# Patient Record
Sex: Male | Born: 1960
Health system: Southern US, Community
[De-identification: ages and names within clinical notes are randomized; demographics above are authoritative.]

## PROBLEM LIST (undated history)

## (undated) DIAGNOSIS — C801 Malignant (primary) neoplasm, unspecified: Secondary | ICD-10-CM

## (undated) DIAGNOSIS — Z789 Other specified health status: Secondary | ICD-10-CM

## (undated) DIAGNOSIS — R112 Nausea with vomiting, unspecified: Secondary | ICD-10-CM

## (undated) DIAGNOSIS — N2889 Other specified disorders of kidney and ureter: Secondary | ICD-10-CM

## (undated) DIAGNOSIS — Z9889 Other specified postprocedural states: Secondary | ICD-10-CM

## (undated) HISTORY — PX: ROTATOR CUFF REPAIR: SHX139

## (undated) HISTORY — PX: TONSILLECTOMY: SUR1361

## (undated) HISTORY — PX: BACK SURGERY: SHX140

---

## 2002-09-06 ENCOUNTER — Encounter: Payer: Self-pay | Admitting: *Deleted

## 2002-09-06 ENCOUNTER — Encounter: Admission: RE | Admit: 2002-09-06 | Discharge: 2002-09-06 | Payer: Self-pay | Admitting: *Deleted

## 2005-03-02 ENCOUNTER — Encounter: Admission: RE | Admit: 2005-03-02 | Discharge: 2005-03-02 | Payer: Self-pay | Admitting: Rheumatology

## 2009-09-08 ENCOUNTER — Encounter: Admission: RE | Admit: 2009-09-08 | Discharge: 2009-10-03 | Payer: Self-pay | Admitting: Otolaryngology

## 2020-07-23 ENCOUNTER — Inpatient Hospital Stay (HOSPITAL_COMMUNITY)
Admission: EM | Admit: 2020-07-23 | Discharge: 2020-07-29 | DRG: 674 | Disposition: A | Discharge status: Home / Self Care | Payer: 59 | Attending: Internal Medicine | Admitting: Internal Medicine

## 2020-07-23 ENCOUNTER — Other Ambulatory Visit: Payer: Self-pay

## 2020-07-23 ENCOUNTER — Encounter (HOSPITAL_COMMUNITY): Payer: Self-pay

## 2020-07-23 ENCOUNTER — Emergency Department (HOSPITAL_COMMUNITY): Payer: 59

## 2020-07-23 DIAGNOSIS — E860 Dehydration: Secondary | ICD-10-CM | POA: Diagnosis present

## 2020-07-23 DIAGNOSIS — R6881 Early satiety: Secondary | ICD-10-CM | POA: Diagnosis not present

## 2020-07-23 DIAGNOSIS — D62 Acute posthemorrhagic anemia: Secondary | ICD-10-CM | POA: Diagnosis present

## 2020-07-23 DIAGNOSIS — N2889 Other specified disorders of kidney and ureter: Secondary | ICD-10-CM | POA: Diagnosis not present

## 2020-07-23 DIAGNOSIS — Z20822 Contact with and (suspected) exposure to covid-19: Secondary | ICD-10-CM | POA: Diagnosis present

## 2020-07-23 DIAGNOSIS — Y9355 Activity, bike riding: Secondary | ICD-10-CM

## 2020-07-23 DIAGNOSIS — R739 Hyperglycemia, unspecified: Secondary | ICD-10-CM | POA: Diagnosis present

## 2020-07-23 DIAGNOSIS — K5900 Constipation, unspecified: Secondary | ICD-10-CM | POA: Diagnosis not present

## 2020-07-23 DIAGNOSIS — S37012A Minor contusion of left kidney, initial encounter: Secondary | ICD-10-CM | POA: Diagnosis present

## 2020-07-23 DIAGNOSIS — N179 Acute kidney failure, unspecified: Secondary | ICD-10-CM | POA: Diagnosis present

## 2020-07-23 DIAGNOSIS — R9431 Abnormal electrocardiogram [ECG] [EKG]: Secondary | ICD-10-CM | POA: Diagnosis present

## 2020-07-23 HISTORY — DX: Other specified health status: Z78.9

## 2020-07-23 LAB — COMPREHENSIVE METABOLIC PANEL
ALT: 19 U/L (ref 0–44)
AST: 50 U/L — ABNORMAL HIGH (ref 15–41)
Albumin: 4.3 g/dL (ref 3.5–5.0)
Alkaline Phosphatase: 27 U/L — ABNORMAL LOW (ref 38–126)
Anion gap: 18 — ABNORMAL HIGH (ref 5–15)
BUN: 35 mg/dL — ABNORMAL HIGH (ref 6–20)
CO2: 20 mmol/L — ABNORMAL LOW (ref 22–32)
Calcium: 9.1 mg/dL (ref 8.9–10.3)
Chloride: 96 mmol/L — ABNORMAL LOW (ref 98–111)
Creatinine, Ser: 1.25 mg/dL — ABNORMAL HIGH (ref 0.61–1.24)
GFR, Estimated: >60 mL/min (ref >60)
Glucose, Bld: 228 mg/dL — ABNORMAL HIGH (ref 70–99)
Potassium: 3.8 mmol/L (ref 3.5–5.1)
Sodium: 134 mmol/L — ABNORMAL LOW (ref 135–145)
Total Bilirubin: 1.3 mg/dL — ABNORMAL HIGH (ref 0.3–1.2)
Total Protein: 6.8 g/dL (ref 6.5–8.1)

## 2020-07-23 LAB — URINALYSIS, ROUTINE W REFLEX MICROSCOPIC
Bilirubin Urine: NEGATIVE
Glucose, UA: NEGATIVE mg/dL
Hgb urine dipstick: NEGATIVE
Ketones, ur: 80 mg/dL — AB
Leukocytes,Ua: NEGATIVE
Nitrite: NEGATIVE
Protein, ur: NEGATIVE mg/dL
Specific Gravity, Urine: 1.035 — ABNORMAL HIGH (ref 1.005–1.030)
pH: 6 (ref 5.0–8.0)

## 2020-07-23 LAB — TYPE AND SCREEN
ABO/RH(D): A POS
Antibody Screen: NEGATIVE

## 2020-07-23 LAB — CBC
HCT: 34.4 % — ABNORMAL LOW (ref 39.0–52.0)
HCT: 33.2 % — ABNORMAL LOW (ref 39.0–52.0)
Hemoglobin: 11.5 g/dL — ABNORMAL LOW (ref 13.0–17.0)
Hemoglobin: 10.9 g/dL — ABNORMAL LOW (ref 13.0–17.0)
MCH: 30.8 pg (ref 26.0–34.0)
MCH: 30.9 pg (ref 26.0–34.0)
MCHC: 33.4 g/dL (ref 30.0–36.0)
MCHC: 32.8 g/dL (ref 30.0–36.0)
MCV: 92.2 fL (ref 80.0–100.0)
MCV: 94.1 fL (ref 80.0–100.0)
Platelets: 180 10*3/uL (ref 150–400)
Platelets: 188 10*3/uL (ref 150–400)
RBC: 3.73 MIL/uL — ABNORMAL LOW (ref 4.22–5.81)
RBC: 3.53 MIL/uL — ABNORMAL LOW (ref 4.22–5.81)
RDW: 12.4 % (ref 11.5–15.5)
RDW: 12.3 % (ref 11.5–15.5)
WBC: 11.9 10*3/uL — ABNORMAL HIGH (ref 4.0–10.5)
WBC: 12.2 10*3/uL — ABNORMAL HIGH (ref 4.0–10.5)
nRBC: 0 % (ref 0.0–0.2)
nRBC: 0 % (ref 0.0–0.2)

## 2020-07-23 LAB — RESPIRATORY PANEL BY RT PCR (FLU A&B, COVID)
Influenza A by PCR: NEGATIVE
Influenza B by PCR: NEGATIVE
SARS Coronavirus 2 by RT PCR: NEGATIVE

## 2020-07-23 LAB — PROTIME-INR
INR: 1.3 — ABNORMAL HIGH (ref 0.8–1.2)
Prothrombin Time: 15.4 seconds — ABNORMAL HIGH (ref 11.4–15.2)

## 2020-07-23 LAB — ABO/RH: ABO/RH(D): A POS

## 2020-07-23 MED ORDER — METOCLOPRAMIDE HCL 5 MG/ML IJ SOLN
10.0000 mg | Freq: Once | INTRAMUSCULAR | Status: AC
  Administered 2020-07-23: 10 mg via INTRAVENOUS
  Filled 2020-07-23: qty 2

## 2020-07-23 MED ORDER — HYDROMORPHONE HCL 1 MG/ML IJ SOLN
0.5000 mg | INTRAMUSCULAR | Status: DC | PRN
  Administered 2020-07-23 – 2020-07-26 (×17): 0.5 mg via INTRAVENOUS
  Filled 2020-07-23 (×17): qty 0.5

## 2020-07-23 MED ORDER — SODIUM CHLORIDE 0.9 % IV SOLN: INTRAVENOUS | Status: AC

## 2020-07-23 MED ORDER — ACETAMINOPHEN 325 MG PO TABS
650.0000 mg | ORAL_TABLET | Freq: Four times a day (QID) | ORAL | Status: DC | PRN
  Administered 2020-07-27: 19:00:00 650 mg via ORAL
  Filled 2020-07-23: qty 2

## 2020-07-23 MED ORDER — ACETAMINOPHEN 650 MG RE SUPP: 650.0000 mg | Freq: Four times a day (QID) | RECTAL | Status: DC | PRN

## 2020-07-23 MED ORDER — MORPHINE SULFATE (PF) 4 MG/ML IV SOLN
4.0000 mg | Freq: Once | INTRAVENOUS | Status: AC
  Administered 2020-07-23: 4 mg via INTRAVENOUS
  Filled 2020-07-23: qty 1

## 2020-07-23 MED ORDER — HYDROMORPHONE HCL 1 MG/ML IJ SOLN
0.5000 mg | Freq: Once | INTRAMUSCULAR | Status: AC
  Administered 2020-07-23: 0.5 mg via INTRAVENOUS
  Filled 2020-07-23: qty 1

## 2020-07-23 MED ORDER — ONDANSETRON HCL 4 MG/2ML IJ SOLN
4.0000 mg | Freq: Once | INTRAMUSCULAR | Status: AC
  Administered 2020-07-23: 4 mg via INTRAVENOUS
  Filled 2020-07-23: qty 2

## 2020-07-23 MED ORDER — SODIUM CHLORIDE 0.9 % IV BOLUS
1000.0000 mL | Freq: Once | INTRAVENOUS | Status: AC
  Administered 2020-07-23: 1000 mL via INTRAVENOUS

## 2020-07-23 MED ORDER — FENTANYL CITRATE (PF) 100 MCG/2ML IJ SOLN
100.0000 ug | Freq: Once | INTRAMUSCULAR | Status: AC
  Administered 2020-07-23: 100 ug via INTRAVENOUS
  Filled 2020-07-23: qty 2

## 2020-07-23 MED ORDER — IOHEXOL 300 MG/ML  SOLN
100.0000 mL | Freq: Once | INTRAMUSCULAR | Status: AC | PRN
  Administered 2020-07-23: 100 mL via INTRAVENOUS
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
## 2020-07-23 NOTE — ED Notes (Signed)
This nurse spoke with Dr. Erenest Blank about the possible need for a CT, provider at bedside speaking with patient.

## 2020-07-24 DIAGNOSIS — E860 Dehydration: Secondary | ICD-10-CM | POA: Diagnosis present

## 2020-07-24 DIAGNOSIS — N179 Acute kidney failure, unspecified: Secondary | ICD-10-CM | POA: Diagnosis present

## 2020-07-24 DIAGNOSIS — S37012A Minor contusion of left kidney, initial encounter: Secondary | ICD-10-CM | POA: Diagnosis present

## 2020-07-24 DIAGNOSIS — Y9355 Activity, bike riding: Secondary | ICD-10-CM | POA: Diagnosis not present

## 2020-07-24 DIAGNOSIS — N2889 Other specified disorders of kidney and ureter: Principal | ICD-10-CM

## 2020-07-24 DIAGNOSIS — Z20822 Contact with and (suspected) exposure to covid-19: Secondary | ICD-10-CM | POA: Diagnosis present

## 2020-07-24 DIAGNOSIS — K5900 Constipation, unspecified: Secondary | ICD-10-CM | POA: Diagnosis not present

## 2020-07-24 DIAGNOSIS — D62 Acute posthemorrhagic anemia: Secondary | ICD-10-CM | POA: Diagnosis present

## 2020-07-24 DIAGNOSIS — R9431 Abnormal electrocardiogram [ECG] [EKG]: Secondary | ICD-10-CM | POA: Diagnosis present

## 2020-07-24 DIAGNOSIS — R739 Hyperglycemia, unspecified: Secondary | ICD-10-CM | POA: Diagnosis present

## 2020-07-24 DIAGNOSIS — R6881 Early satiety: Secondary | ICD-10-CM | POA: Diagnosis not present

## 2020-07-24 LAB — MAGNESIUM: Magnesium: 2.1 mg/dL (ref 1.7–2.4)

## 2020-07-24 LAB — CBC
HCT: 32.2 % — ABNORMAL LOW (ref 39.0–52.0)
HCT: 33 % — ABNORMAL LOW (ref 39.0–52.0)
HCT: 31.1 % — ABNORMAL LOW (ref 39.0–52.0)
Hemoglobin: 10.7 g/dL — ABNORMAL LOW (ref 13.0–17.0)
Hemoglobin: 11 g/dL — ABNORMAL LOW (ref 13.0–17.0)
Hemoglobin: 10.5 g/dL — ABNORMAL LOW (ref 13.0–17.0)
MCH: 30.7 pg (ref 26.0–34.0)
MCH: 30.9 pg (ref 26.0–34.0)
MCH: 31 pg (ref 26.0–34.0)
MCHC: 33.2 g/dL (ref 30.0–36.0)
MCHC: 33.3 g/dL (ref 30.0–36.0)
MCHC: 33.8 g/dL (ref 30.0–36.0)
MCV: 92.5 fL (ref 80.0–100.0)
MCV: 92.7 fL (ref 80.0–100.0)
MCV: 91.7 fL (ref 80.0–100.0)
Platelets: 170 10*3/uL (ref 150–400)
Platelets: 152 10*3/uL (ref 150–400)
Platelets: 158 10*3/uL (ref 150–400)
RBC: 3.48 MIL/uL — ABNORMAL LOW (ref 4.22–5.81)
RBC: 3.56 MIL/uL — ABNORMAL LOW (ref 4.22–5.81)
RBC: 3.39 MIL/uL — ABNORMAL LOW (ref 4.22–5.81)
RDW: 12.5 % (ref 11.5–15.5)
RDW: 12.4 % (ref 11.5–15.5)
RDW: 12.6 % (ref 11.5–15.5)
WBC: 10.1 10*3/uL (ref 4.0–10.5)
WBC: 9.5 10*3/uL (ref 4.0–10.5)
WBC: 10.8 10*3/uL — ABNORMAL HIGH (ref 4.0–10.5)
nRBC: 0 % (ref 0.0–0.2)
nRBC: 0 % (ref 0.0–0.2)
nRBC: 0 % (ref 0.0–0.2)

## 2020-07-24 LAB — COMPREHENSIVE METABOLIC PANEL
ALT: 19 U/L (ref 0–44)
AST: 56 U/L — ABNORMAL HIGH (ref 15–41)
Albumin: 4 g/dL (ref 3.5–5.0)
Alkaline Phosphatase: 25 U/L — ABNORMAL LOW (ref 38–126)
Anion gap: 12 (ref 5–15)
BUN: 25 mg/dL — ABNORMAL HIGH (ref 6–20)
CO2: 22 mmol/L (ref 22–32)
Calcium: 8.8 mg/dL — ABNORMAL LOW (ref 8.9–10.3)
Chloride: 104 mmol/L (ref 98–111)
Creatinine, Ser: 0.93 mg/dL (ref 0.61–1.24)
GFR, Estimated: >60 mL/min (ref >60)
Glucose, Bld: 196 mg/dL — ABNORMAL HIGH (ref 70–99)
Potassium: 4.5 mmol/L (ref 3.5–5.1)
Sodium: 138 mmol/L (ref 135–145)
Total Bilirubin: 0.7 mg/dL (ref 0.3–1.2)
Total Protein: 6.4 g/dL — ABNORMAL LOW (ref 6.5–8.1)

## 2020-07-24 LAB — HIV ANTIBODY (ROUTINE TESTING W REFLEX): HIV Screen 4th Generation wRfx: NONREACTIVE

## 2020-07-24 MED ORDER — ONDANSETRON HCL 4 MG/2ML IJ SOLN
4.0000 mg | Freq: Four times a day (QID) | INTRAMUSCULAR | Status: DC | PRN
  Administered 2020-07-24 – 2020-07-26 (×6): 4 mg via INTRAVENOUS
  Filled 2020-07-24 (×5): qty 2

## 2020-07-24 MED ORDER — DOCUSATE SODIUM 100 MG PO CAPS
100.0000 mg | ORAL_CAPSULE | Freq: Two times a day (BID) | ORAL | Status: DC
  Administered 2020-07-24 – 2020-07-29 (×10): 100 mg via ORAL
  Filled 2020-07-24 (×9): qty 1

## 2020-07-24 MED ORDER — POLYETHYLENE GLYCOL 3350 17 G PO PACK
17.0000 g | PACK | Freq: Every day | ORAL | Status: DC | PRN
  Administered 2020-07-24 – 2020-07-25 (×2): 17 g via ORAL
  Filled 2020-07-24 (×2): qty 1

## 2020-07-24 MED ORDER — SENNA 8.6 MG PO TABS
1.0000 | ORAL_TABLET | Freq: Every day | ORAL | Status: DC
  Administered 2020-07-24 – 2020-07-29 (×6): 8.6 mg via ORAL
  Filled 2020-07-24 (×6): qty 1
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
## 2020-07-25 ENCOUNTER — Telehealth (HOSPITAL_COMMUNITY): Payer: Self-pay

## 2020-07-25 ENCOUNTER — Inpatient Hospital Stay (HOSPITAL_COMMUNITY): Payer: 59

## 2020-07-25 HISTORY — PX: IR RENAL SUPRASEL UNI S&I MOD SED: IMG655

## 2020-07-25 HISTORY — PX: IR RENAL SELECTIVE  UNI INC S&I MOD SED: IMG654

## 2020-07-25 HISTORY — PX: IR EMBO ART  VEN HEMORR LYMPH EXTRAV  INC GUIDE ROADMAPPING: IMG5450

## 2020-07-25 HISTORY — PX: IR US GUIDE VASC ACCESS RIGHT: IMG2390

## 2020-07-25 LAB — COMPREHENSIVE METABOLIC PANEL
ALT: 20 U/L (ref 0–44)
AST: 136 U/L — ABNORMAL HIGH (ref 15–41)
Albumin: 3.8 g/dL (ref 3.5–5.0)
Alkaline Phosphatase: 29 U/L — ABNORMAL LOW (ref 38–126)
Anion gap: 7 (ref 5–15)
BUN: 16 mg/dL (ref 6–20)
CO2: 26 mmol/L (ref 22–32)
Calcium: 8.7 mg/dL — ABNORMAL LOW (ref 8.9–10.3)
Chloride: 100 mmol/L (ref 98–111)
Creatinine, Ser: 0.83 mg/dL (ref 0.61–1.24)
GFR, Estimated: >60 mL/min (ref >60)
Glucose, Bld: 137 mg/dL — ABNORMAL HIGH (ref 70–99)
Potassium: 4.5 mmol/L (ref 3.5–5.1)
Sodium: 133 mmol/L — ABNORMAL LOW (ref 135–145)
Total Bilirubin: 0.9 mg/dL (ref 0.3–1.2)
Total Protein: 6.7 g/dL (ref 6.5–8.1)

## 2020-07-25 LAB — HEMOGLOBIN AND HEMATOCRIT, BLOOD
HCT: 26.1 % — ABNORMAL LOW (ref 39.0–52.0)
Hemoglobin: 8.7 g/dL — ABNORMAL LOW (ref 13.0–17.0)

## 2020-07-25 LAB — CBC
HCT: 28.9 % — ABNORMAL LOW (ref 39.0–52.0)
Hemoglobin: 9.6 g/dL — ABNORMAL LOW (ref 13.0–17.0)
MCH: 31.1 pg (ref 26.0–34.0)
MCHC: 33.2 g/dL (ref 30.0–36.0)
MCV: 93.5 fL (ref 80.0–100.0)
Platelets: 143 10*3/uL — ABNORMAL LOW (ref 150–400)
RBC: 3.09 MIL/uL — ABNORMAL LOW (ref 4.22–5.81)
RDW: 12.5 % (ref 11.5–15.5)
WBC: 11.1 10*3/uL — ABNORMAL HIGH (ref 4.0–10.5)
nRBC: 0 % (ref 0.0–0.2)

## 2020-07-25 LAB — HEMOGLOBIN A1C
Hgb A1c MFr Bld: 5.5 % (ref 4.8–5.6)
Mean Plasma Glucose: 111 mg/dL

## 2020-07-25 MED ORDER — IOHEXOL 350 MG/ML SOLN
100.0000 mL | Freq: Once | INTRAVENOUS | Status: AC | PRN
  Administered 2020-07-25: 16:00:00 100 mL via INTRAVENOUS

## 2020-07-25 MED ORDER — MIDAZOLAM HCL 2 MG/2ML IJ SOLN
INTRAMUSCULAR | Status: AC | PRN
  Administered 2020-07-25: 0.5 mg via INTRAVENOUS
  Administered 2020-07-25: 1 mg via INTRAVENOUS
  Administered 2020-07-25 (×4): 0.5 mg via INTRAVENOUS

## 2020-07-25 MED ORDER — IOHEXOL 300 MG/ML  SOLN
100.0000 mL | Freq: Once | INTRAMUSCULAR | Status: AC | PRN
  Administered 2020-07-25: 20:00:00 40 mL via INTRA_ARTERIAL

## 2020-07-25 MED ORDER — FENTANYL CITRATE (PF) 100 MCG/2ML IJ SOLN
INTRAMUSCULAR | Status: AC | PRN
Start: 2020-07-25 — End: 2020-07-25
  Administered 2020-07-25 (×7): 25 ug via INTRAVENOUS

## 2020-07-25 MED ORDER — FENTANYL CITRATE (PF) 100 MCG/2ML IJ SOLN
INTRAMUSCULAR | Status: AC
  Filled 2020-07-25: qty 4

## 2020-07-25 MED ORDER — HYDROCODONE-ACETAMINOPHEN 5-325 MG PO TABS
1.0000 | ORAL_TABLET | ORAL | Status: DC | PRN
  Administered 2020-07-25 – 2020-07-27 (×5): 2 via ORAL
  Administered 2020-07-27 (×2): 1 via ORAL
  Administered 2020-07-27 – 2020-07-28 (×2): 2 via ORAL
  Filled 2020-07-25: qty 2
  Filled 2020-07-25: qty 1
  Filled 2020-07-25 (×6): qty 2
  Filled 2020-07-25: qty 1
  Filled 2020-07-25: qty 2

## 2020-07-25 MED ORDER — SODIUM CHLORIDE 0.9 % IV SOLN
INTRAVENOUS | Status: AC | PRN
  Administered 2020-07-25: 50 mL/h via INTRAVENOUS

## 2020-07-25 MED ORDER — IOHEXOL 9 MG/ML PO SOLN: 500.0000 mL | ORAL | Status: DC

## 2020-07-25 MED ORDER — LIDOCAINE HCL 1 % IJ SOLN
INTRAMUSCULAR | Status: AC
  Administered 2020-07-25: 5 mL via SUBCUTANEOUS
  Filled 2020-07-25: qty 20

## 2020-07-25 MED ORDER — IOHEXOL 300 MG/ML  SOLN
100.0000 mL | Freq: Once | INTRAMUSCULAR | Status: AC | PRN
  Administered 2020-07-25: 20:00:00 66 mL via INTRA_ARTERIAL

## 2020-07-25 MED ORDER — HYDROMORPHONE HCL 1 MG/ML IJ SOLN
1.0000 mg | Freq: Once | INTRAMUSCULAR | Status: AC
  Administered 2020-07-25: 1 mg via INTRAVENOUS
  Filled 2020-07-25: qty 1

## 2020-07-25 MED ORDER — MIDAZOLAM HCL 2 MG/2ML IJ SOLN
INTRAMUSCULAR | Status: AC
  Filled 2020-07-25: qty 4

## 2020-07-25 MED ORDER — ONDANSETRON HCL 4 MG/2ML IJ SOLN
INTRAMUSCULAR | Status: AC
  Filled 2020-07-25: qty 2
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
## 2020-07-25 NOTE — Progress Notes (Signed)
Wasted .5mg  Versed and 75mcg Fentanyl  Witnessed by Jill Side.  Not available to pick in pyxis
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
## 2020-07-25 NOTE — Procedures (Signed)
  Procedure: L renal arteriogram, subselective coil embolization    EBL:   minimal Complications:  none immediate  See full dictation in [facility].  Dillard Cannon MD Main # [phone] Pager  [phone] Mobile [phone]

## 2020-07-26 LAB — COMPREHENSIVE METABOLIC PANEL
ALT: 19 U/L (ref 0–44)
AST: 124 U/L — ABNORMAL HIGH (ref 15–41)
Albumin: 3.4 g/dL — ABNORMAL LOW (ref 3.5–5.0)
Alkaline Phosphatase: 31 U/L — ABNORMAL LOW (ref 38–126)
Anion gap: 7 (ref 5–15)
BUN: 12 mg/dL (ref 6–20)
CO2: 28 mmol/L (ref 22–32)
Calcium: 8.6 mg/dL — ABNORMAL LOW (ref 8.9–10.3)
Chloride: 98 mmol/L (ref 98–111)
Creatinine, Ser: 0.86 mg/dL (ref 0.61–1.24)
GFR, Estimated: >60 mL/min (ref >60)
Glucose, Bld: 112 mg/dL — ABNORMAL HIGH (ref 70–99)
Potassium: 4.7 mmol/L (ref 3.5–5.1)
Sodium: 133 mmol/L — ABNORMAL LOW (ref 135–145)
Total Bilirubin: 0.7 mg/dL (ref 0.3–1.2)
Total Protein: 6.4 g/dL — ABNORMAL LOW (ref 6.5–8.1)

## 2020-07-26 LAB — CBC
HCT: 26.6 % — ABNORMAL LOW (ref 39.0–52.0)
Hemoglobin: 8.9 g/dL — ABNORMAL LOW (ref 13.0–17.0)
MCH: 31 pg (ref 26.0–34.0)
MCHC: 33.5 g/dL (ref 30.0–36.0)
MCV: 92.7 fL (ref 80.0–100.0)
Platelets: 126 10*3/uL — ABNORMAL LOW (ref 150–400)
RBC: 2.87 MIL/uL — ABNORMAL LOW (ref 4.22–5.81)
RDW: 12.1 % (ref 11.5–15.5)
WBC: 9.9 10*3/uL (ref 4.0–10.5)
nRBC: 0 % (ref 0.0–0.2)
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
## 2020-07-26 NOTE — Progress Notes (Signed)
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 Consolidation/atelectasis posteriorly in the visualized lung bases. Hepatobiliary: No focal liver abnormality is seen. No gallstones, gallbladder wall thickening, or biliary dilatation. Pancreas: Unremarkable. No pancreatic ductal dilatation or surrounding inflammatory changes. Spleen: Normal in size without focal abnormality. Adrenals/Urinary Tract: Right adrenal gland unremarkable. Left adrenal gland is somewhat obscured by regional retroperitoneal hemorrhage as before. Right kidney is normal. On the left, complex hemorrhagic upper pole process in the kidney measuring 15 x 12.8 cm maximum transverse dimensions (previously 14.7 x 12.2 by my measurement). This creates significant mass effect upon the upper pole renal parenchyma which is draped around the process. There is continued active arterial supply into the hemorrhagic process from upper pole left renal artery branches. A few scattered coarse calcifications at the lateral margin of the hemorrhagic process. The lower pole  enhances normally. No hydronephrosis. Stomach/Bowel: Stomach is decompressed. Small bowel is nondilated. Moderate proximal colonic fecal material, decompressed distally. Lymphatic: No abdominal or pelvic adenopathy. Reproductive: Prostate is unremarkable. Other: Slight increase in left retroperitoneal hemorrhage around the kidney and extending anterior to the iliopsoas musculature. There is a small volume abdominal and pelvic ascites, new since previous. No free air. Musculoskeletal: Postop changes in the lower lumbar spine. Negative for fracture or other acute bone abnormality. IMPRESSION: 1. Continued active arterial supply into the large left upper pole renal hemorrhagic process, as detailed above. 2. Slight increase in left retroperitoneal hemorrhage. 3. New small volume abdominal and pelvic ascites, and bilateral pleural effusions, left greater than right. Electronically Signed   By: Lucrezia Europe M.D.   On: 07/26/2020 10:46    Scheduled Meds: . docusate sodium  100 mg Oral BID  . senna  1 tablet Oral Daily   Continuous Infusions:    LOS: 2 days   Marylu Lund, MD [facility] Pager On Amion  If 7PM-7AM, please contact night-coverage 07/26/2020, 2:39 PM
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
## 2020-07-27 LAB — CBC
HCT: 24.3 % — ABNORMAL LOW (ref 39.0–52.0)
Hemoglobin: 8 g/dL — ABNORMAL LOW (ref 13.0–17.0)
MCH: 30.8 pg (ref 26.0–34.0)
MCHC: 32.9 g/dL (ref 30.0–36.0)
MCV: 93.5 fL (ref 80.0–100.0)
Platelets: 156 10*3/uL (ref 150–400)
RBC: 2.6 MIL/uL — ABNORMAL LOW (ref 4.22–5.81)
RDW: 12.2 % (ref 11.5–15.5)
WBC: 8 10*3/uL (ref 4.0–10.5)
nRBC: 0 % (ref 0.0–0.2)

## 2020-07-27 LAB — COMPREHENSIVE METABOLIC PANEL
ALT: 18 U/L (ref 0–44)
AST: 102 U/L — ABNORMAL HIGH (ref 15–41)
Albumin: 3.1 g/dL — ABNORMAL LOW (ref 3.5–5.0)
Alkaline Phosphatase: 36 U/L — ABNORMAL LOW (ref 38–126)
Anion gap: 9 (ref 5–15)
BUN: 17 mg/dL (ref 6–20)
CO2: 27 mmol/L (ref 22–32)
Calcium: 8.5 mg/dL — ABNORMAL LOW (ref 8.9–10.3)
Chloride: 96 mmol/L — ABNORMAL LOW (ref 98–111)
Creatinine, Ser: 1.04 mg/dL (ref 0.61–1.24)
GFR, Estimated: >60 mL/min (ref >60)
Glucose, Bld: 118 mg/dL — ABNORMAL HIGH (ref 70–99)
Potassium: 3.8 mmol/L (ref 3.5–5.1)
Sodium: 132 mmol/L — ABNORMAL LOW (ref 135–145)
Total Bilirubin: 0.7 mg/dL (ref 0.3–1.2)
Total Protein: 6.4 g/dL — ABNORMAL LOW (ref 6.5–8.1)

## 2020-07-27 LAB — HEMOGLOBIN AND HEMATOCRIT, BLOOD
HCT: 23.4 % — ABNORMAL LOW (ref 39.0–52.0)
Hemoglobin: 7.8 g/dL — ABNORMAL LOW (ref 13.0–17.0)

## 2020-07-27 MED ORDER — POLYETHYLENE GLYCOL 3350 17 GM/SCOOP PO POWD
1.0000 | Freq: Once | ORAL | Status: AC
  Administered 2020-07-27: 16:00:00 255 g via ORAL
  Filled 2020-07-27: qty 255
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
## 2020-07-27 NOTE — Progress Notes (Signed)
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 renal veins, IVC, iliac venous system. Circumaortic left renal vein, an anatomic variant. No venous pathology identified. Review of the MIP images confirms the above findings. NON-VASCULAR Lower chest: New small pleural effusions, left greater than right. Consolidation/atelectasis posteriorly in the visualized lung bases. Hepatobiliary: No focal liver abnormality is seen. No gallstones, gallbladder wall thickening, or biliary dilatation. Pancreas: Unremarkable. No pancreatic ductal dilatation or surrounding inflammatory changes. Spleen: Normal in size without focal abnormality. Adrenals/Urinary Tract: Right adrenal gland unremarkable. Left adrenal gland is somewhat obscured by regional retroperitoneal hemorrhage as before. Right kidney is normal. On the left, complex hemorrhagic upper pole process in the kidney measuring 15 x 12.8 cm maximum transverse dimensions (previously 14.7 x 12.2 by my measurement). This creates significant mass  effect upon the upper pole renal parenchyma which is draped around the process. There is continued active arterial supply into the hemorrhagic process from upper pole left renal artery branches. A few scattered coarse calcifications at the lateral margin of the hemorrhagic process. The lower pole enhances normally. No hydronephrosis. Stomach/Bowel: Stomach is decompressed. Small bowel is nondilated. Moderate proximal colonic fecal material, decompressed distally. Lymphatic: No abdominal or pelvic adenopathy. Reproductive: Prostate is unremarkable. Other: Slight increase in left retroperitoneal hemorrhage around the kidney and extending anterior to the iliopsoas musculature. There is a small volume abdominal and pelvic ascites, new since previous. No free air. Musculoskeletal: Postop changes in the lower lumbar spine. Negative for fracture or other acute bone abnormality. IMPRESSION: 1. Continued active arterial supply into the large left upper pole renal hemorrhagic process, as detailed above. 2. Slight increase in left retroperitoneal hemorrhage. 3. New small volume abdominal and pelvic ascites, and bilateral pleural effusions, left greater than right. Electronically Signed   By: Lucrezia Europe M.D.   On: 07/26/2020 10:46    Scheduled Meds: . docusate sodium  100 mg Oral BID  . polyethylene glycol powder  1 Container Oral Once  . senna  1 tablet Oral Daily   Continuous Infusions:    LOS: 3 days   Marylu Lund, MD [facility] Pager On Amion  If 7PM-7AM, please contact night-coverage 07/27/2020, 1:38 PM

## 2020-07-28 DIAGNOSIS — Y9355 Activity, bike riding: Secondary | ICD-10-CM

## 2020-07-28 LAB — CBC
HCT: 22 % — ABNORMAL LOW (ref 39.0–52.0)
Hemoglobin: 7.4 g/dL — ABNORMAL LOW (ref 13.0–17.0)
MCH: 31 pg (ref 26.0–34.0)
MCHC: 33.6 g/dL (ref 30.0–36.0)
MCV: 92.1 fL (ref 80.0–100.0)
Platelets: 188 10*3/uL (ref 150–400)
RBC: 2.39 MIL/uL — ABNORMAL LOW (ref 4.22–5.81)
RDW: 12.4 % (ref 11.5–15.5)
WBC: 7.7 10*3/uL (ref 4.0–10.5)
nRBC: 0 % (ref 0.0–0.2)

## 2020-07-28 LAB — COMPREHENSIVE METABOLIC PANEL
ALT: 35 U/L (ref 0–44)
AST: 96 U/L — ABNORMAL HIGH (ref 15–41)
Albumin: 2.8 g/dL — ABNORMAL LOW (ref 3.5–5.0)
Alkaline Phosphatase: 62 U/L (ref 38–126)
Anion gap: 9 (ref 5–15)
BUN: 18 mg/dL (ref 6–20)
CO2: 28 mmol/L (ref 22–32)
Calcium: 8.4 mg/dL — ABNORMAL LOW (ref 8.9–10.3)
Chloride: 95 mmol/L — ABNORMAL LOW (ref 98–111)
Creatinine, Ser: 1.01 mg/dL (ref 0.61–1.24)
GFR, Estimated: >60 mL/min (ref >60)
Glucose, Bld: 125 mg/dL — ABNORMAL HIGH (ref 70–99)
Potassium: 3.5 mmol/L (ref 3.5–5.1)
Sodium: 132 mmol/L — ABNORMAL LOW (ref 135–145)
Total Bilirubin: 0.8 mg/dL (ref 0.3–1.2)
Total Protein: 6.1 g/dL — ABNORMAL LOW (ref 6.5–8.1)

## 2020-07-28 LAB — HEMOGLOBIN AND HEMATOCRIT, BLOOD
HCT: 24.8 % — ABNORMAL LOW (ref 39.0–52.0)
Hemoglobin: 8.3 g/dL — ABNORMAL LOW (ref 13.0–17.0)

## 2020-07-28 MED ORDER — ACETAMINOPHEN 325 MG PO TABS: 650.0000 mg | ORAL_TABLET | Freq: Four times a day (QID) | ORAL | Status: DC | PRN

## 2020-07-28 MED ORDER — HYDROCODONE-ACETAMINOPHEN 5-325 MG PO TABS: 1.0000 | ORAL_TABLET | ORAL | Status: DC | PRN

## 2020-07-28 MED ORDER — ACETAMINOPHEN 650 MG RE SUPP: 650.0000 mg | Freq: Four times a day (QID) | RECTAL | Status: DC | PRN

## 2020-07-28 MED ORDER — ENSURE MAX PROTEIN PO LIQD
11.0000 [oz_av] | Freq: Two times a day (BID) | ORAL | Status: DC
  Administered 2020-07-28 – 2020-07-29 (×3): 11 [oz_av] via ORAL

## 2020-07-28 MED ORDER — ACETAMINOPHEN 10 MG/ML IV SOLN
1000.0000 mg | Freq: Four times a day (QID) | INTRAVENOUS | Status: AC
  Administered 2020-07-28 – 2020-07-29 (×4): 1000 mg via INTRAVENOUS
  Filled 2020-07-28 (×4): qty 100
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
## 2020-07-28 NOTE — Progress Notes (Signed)
Subjective: Patient reports continued pain, appetite diminished.  He would rather not be on narcotics.  Objective: Vital signs in last 24 hours: Temp:  [98.4 F (36.9 C)-99.7 F (37.6 C)] 98.4 F (36.9 C) (10/25 0735) Pulse Rate:  [84-91] 84 (10/25 0735) Resp:  [18-20] 18 (10/25 0735) BP: (112-126)/(68-74) 124/71 (10/25 0735) SpO2:  [60 %-95 %] 95 % (10/25 0857)  Intake/Output from previous day: 10/24 0701 - 10/25 0700 In: 1440 [P.O.:1440] Out: 0  Intake/Output this shift: No intake/output data recorded.  Physical Exam:  Constitutional: Vital signs reviewed. WD WN in NAD   .  Voice is week. Eyes: PERRL, No scleral icterus.   Cardiovascular: RRR Pulmonary/Chest: Normal effort Extremities: No cyanosis or edema   Lab Results: Recent Labs    07/27/20 0613 07/27/20 1135 07/28/20 0500  HGB 8.0* 7.8* 7.4*  HCT 24.3* 23.4* 22.0*   BMET Recent Labs    07/27/20 0613 07/28/20 0500  NA 132* 132*  K 3.8 3.5  CL 96* 95*  CO2 27 28  GLUCOSE 118* 125*  BUN 17 18  CREATININE 1.04 1.01  CALCIUM 8.5* 8.4*   No results for input(s): LABPT, INR in the last 72 hours. No results for input(s): LABURIN in the last 72 hours. Results for orders placed or performed during the hospital encounter of 07/23/20  Respiratory Panel by RT PCR (Flu A&B, Covid) - Nasopharyngeal Swab     Status: None   Collection Time: 07/23/20  8:30 PM   Specimen: Nasopharyngeal Swab  Result Value Ref Range Status   SARS Coronavirus 2 by RT PCR NEGATIVE NEGATIVE Final    Comment: (NOTE) SARS-CoV-2 target nucleic acids are NOT DETECTED.  The SARS-CoV-2 RNA is generally detectable in upper respiratoy specimens during the acute phase of infection. The lowest concentration of SARS-CoV-2 viral copies this assay can detect is 131 copies/mL. A negative result does not preclude SARS-Cov-2 infection and should not be used as the sole basis for treatment or other patient management decisions. A negative result  may occur with  improper specimen collection/handling, submission of specimen other than nasopharyngeal swab, presence of viral mutation(s) within the areas targeted by this assay, and inadequate number of viral copies (<131 copies/mL). A negative result must be combined with clinical observations, patient history, and epidemiological information. The expected result is Negative.  Fact Sheet for Patients:  PinkCheek.be  Fact Sheet for Healthcare Providers:  GravelBags.it  This test is no t yet approved or cleared by the [place] FDA and  has been authorized for detection and/or diagnosis of SARS-CoV-2 by FDA under an Emergency Use Authorization (EUA). This EUA will remain  in effect (meaning this test can be used) for the duration of the COVID-19 declaration under Section 564(b)(1) of the Act, 21 U.S.C. section 360bbb-3(b)(1), unless the authorization is terminated or revoked sooner.     Influenza A by PCR NEGATIVE NEGATIVE Final   Influenza B by PCR NEGATIVE NEGATIVE Final    Comment: (NOTE) The Xpert Xpress SARS-CoV-2/FLU/RSV assay is intended as an aid in  the diagnosis of influenza from Nasopharyngeal swab specimens and  should not be used as a sole basis for treatment. Nasal washings and  aspirates are unacceptable for Xpert Xpress SARS-CoV-2/FLU/RSV  testing.  Fact Sheet for Patients: PinkCheek.be  Fact Sheet for Healthcare Providers: GravelBags.it  This test is not yet approved or cleared by the [place] FDA and  has been authorized for detection and/or diagnosis of SARS-CoV-2 by  FDA under an Emergency  Use Authorization (EUA). This EUA will remain  in effect (meaning this test can be used) for the duration of the  Covid-19 declaration under Section 564(b)(1) of the Act, 21  U.S.C. section 360bbb-3(b)(1), unless the authorization is  terminated  or revoked. Performed at [facility], [place] [address] [place], [place] [address]     Studies/Results: No results found.  Assessment/Plan:     Left perirenal hematoma, significant bleed, necessitating embolization 3 days ago.  Slightly decreased hemoglobin this morning can paired yesterday, but overall stable hemodynamically.  He requests something besides narcotics, I will order IV Tylenol.  I have ordered protein shake supplements, I would recommend following at least 24 more hours to assure stable hemoglobin.    I did discuss with him the fact that he may have an underlying neoplasm as he does have neovascularity noted on arteriogram.  We will obviously need to follow-up after discharge.   LOS: 4 days   Jorja Loa 07/28/2020, 9:16 AM

## 2020-07-29 LAB — HEMOGLOBIN AND HEMATOCRIT, BLOOD
HCT: 23.9 % — ABNORMAL LOW (ref 39.0–52.0)
Hemoglobin: 7.9 g/dL — ABNORMAL LOW (ref 13.0–17.0)

## 2020-07-29 MED ORDER — ONDANSETRON HCL 4 MG PO TABS: 4.0000 mg | ORAL_TABLET | Freq: Every day | ORAL | 0 refills | Status: AC | PRN

## 2020-07-29 MED ORDER — SENNA 8.6 MG PO TABS: 1.0000 | ORAL_TABLET | Freq: Every day | ORAL | 0 refills | Status: AC

## 2020-07-29 MED ORDER — IRON 325 (65 FE) MG PO TABS: 1.0000 | ORAL_TABLET | Freq: Every day | ORAL | 0 refills | Status: AC

## 2020-07-29 MED ORDER — DOCUSATE SODIUM 100 MG PO CAPS: 100.0000 mg | ORAL_CAPSULE | Freq: Two times a day (BID) | ORAL | 0 refills | Status: AC

## 2020-07-29 MED ORDER — FOLIC ACID 1 MG PO TABS: 1.0000 mg | ORAL_TABLET | Freq: Every day | ORAL | 0 refills | Status: AC

## 2020-07-29 MED ORDER — ACETAMINOPHEN 325 MG PO TABS: 650.0000 mg | ORAL_TABLET | Freq: Four times a day (QID) | ORAL | 0 refills | Status: AC | PRN

## 2020-07-29 MED ORDER — POLYETHYLENE GLYCOL 3350 17 G PO PACK: 17.0000 g | PACK | Freq: Every day | ORAL | 0 refills | Status: AC | PRN

## 2020-07-29 NOTE — Discharge Summary (Signed)
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 Musculoskeletal: Postop changes in the lower lumbar spine. Negative for fracture or other acute bone abnormality. IMPRESSION: 1. Continued active arterial supply into the large left upper pole renal hemorrhagic process, as detailed above. 2. Slight increase in left retroperitoneal hemorrhage. 3. New small volume abdominal and pelvic ascites, and bilateral pleural effusions, left greater than right. Electronically Signed   By: Lucrezia Europe M.D.   On: 07/26/2020 10:46     Subjective: Eager to go home today  Discharge Exam: Vitals:   07/29/20 0557 07/29/20 0942  BP: 123/75 131/80  Pulse: 82 85  Resp: 18 20  Temp: 98.4 F (36.9 C) 99.1 F (37.3 C)  SpO2: 96% 96%   Vitals:   07/28/20 1831 07/28/20 2223 07/29/20 0557 07/29/20 0942  BP:  135/75 123/75 131/80  Pulse:  82 82 85  Resp:  18 18 20   Temp:  98.7 F (37.1 C) 98.4 F (36.9 C) 99.1 F (37.3 C)  TempSrc:  Oral Oral Oral  SpO2: 95% 100% 96% 96%  Weight:      Height:        General: Pt is alert, awake, not in acute distress Cardiovascular: RRR, S1/S2 +, no rubs, no gallops Respiratory: CTA bilaterally, no wheezing, no rhonchi Abdominal: Soft, NT, ND, bowel sounds + Extremities: no edema, no cyanosis   The results of significant diagnostics  from this hospitalization (including imaging, microbiology, ancillary and laboratory) are listed below for reference.     Microbiology: Recent Results (from the past 240 hour(s))  Respiratory Panel by RT PCR (Flu A&B, Covid) - Nasopharyngeal Swab     Status: None   Collection Time: 07/23/20  8:30 PM   Specimen: Nasopharyngeal Swab  Result Value Ref Range Status   SARS Coronavirus 2 by RT PCR NEGATIVE NEGATIVE Final    Comment: (NOTE) SARS-CoV-2 target nucleic acids are NOT DETECTED.  The SARS-CoV-2 RNA is generally detectable in upper respiratoy specimens during the acute phase of infection. The lowest concentration of SARS-CoV-2 viral copies this assay can detect is 131 copies/mL. A negative result does not preclude SARS-Cov-2 infection and should not be used as the sole basis for treatment or other patient management decisions. A negative result may occur with  improper specimen collection/handling, submission of specimen other than nasopharyngeal swab, presence of viral mutation(s) within the areas targeted by this assay, and inadequate number of viral copies (<131 copies/mL). A negative result must be combined with clinical observations, patient history, and epidemiological information. The expected result is Negative.  Fact Sheet for Patients:  PinkCheek.be  Fact Sheet for Healthcare Providers:  GravelBags.it  This test is no t yet approved or cleared by the [place] FDA and  has been authorized for detection and/or diagnosis of SARS-CoV-2 by FDA under an Emergency Use Authorization (EUA). This EUA will remain  in effect (meaning this test can be used) for the duration of the COVID-19 declaration under Section 564(b)(1) of the Act, 21 U.S.C. section 360bbb-3(b)(1), unless the authorization is terminated or revoked sooner.     Influenza A by PCR NEGATIVE NEGATIVE Final   Influenza B by PCR NEGATIVE NEGATIVE  Final    Comment: (NOTE) The Xpert Xpress SARS-CoV-2/FLU/RSV assay is intended as an aid in  the diagnosis of influenza from Nasopharyngeal swab specimens and  should not be used as a sole basis for treatment. Nasal washings and  aspirates are unacceptable for Xpert Xpress SARS-CoV-2/FLU/RSV  testing.  Fact Sheet for Patients: PinkCheek.be  Fact Sheet for Healthcare Providers: GravelBags.it  This test is not yet approved or cleared by the [place] FDA and  has been authorized for detection and/or diagnosis of SARS-CoV-2 by  FDA under an Emergency Use Authorization (EUA). This EUA will remain  in effect (meaning this test can be used) for the duration of the  Covid-19 declaration under Section 564(b)(1) of the Act, 21  U.S.C. section 360bbb-3(b)(1), unless the authorization is  terminated or revoked. Performed at [facility], [place] [address], [place], [place] [address]      Labs: BNP (last  3 results) No results for input(s): BNP in the last 8760 hours. Basic Metabolic Panel: Recent Labs  Lab 07/24/20 0537 07/25/20 0420 07/26/20 0806 07/27/20 0613 07/28/20 0500  NA 138 133* 133* 132* 132*  K 4.5 4.5 4.7 3.8 3.5  CL 104 100 98 96* 95*  CO2 22 26 28 27 28   GLUCOSE 196* 137* 112* 118* 125*  BUN 25* 16 12 17 18   CREATININE 0.93 0.83 0.86 1.04 1.01  CALCIUM 8.8* 8.7* 8.6* 8.5* 8.4*  MG 2.1  --   --   --   --    Liver Function Tests: Recent Labs  Lab 07/24/20 0537 07/25/20 0420 07/26/20 0806 07/27/20 0613 07/28/20 0500  AST 56* 136* 124* 102* 96*  ALT 19 20 19 18  35  ALKPHOS 25* 29* 31* 36* 62  BILITOT 0.7 0.9 0.7 0.7 0.8  PROT 6.4* 6.7 6.4* 6.4* 6.1*  ALBUMIN 4.0 3.8 3.4* 3.1* 2.8*   No results for input(s): LIPASE, AMYLASE in the last 168 hours. No results for input(s): AMMONIA in the last 168 hours. CBC: Recent Labs  Lab 07/24/20 0954 07/24/20 0954 07/25/20 0420  07/25/20 1136 07/26/20 0806 07/26/20 0806 07/27/20 0613 07/27/20 1135 07/28/20 0500 07/28/20 1642 07/29/20 0441  WBC 10.8*  --  11.1*  --  9.9  --  8.0  --  7.7  --   --   HGB 10.5*   < > 9.6*   < > 8.9*   < > 8.0* 7.8* 7.4* 8.3* 7.9*  HCT 31.1*   < > 28.9*   < > 26.6*   < > 24.3* 23.4* 22.0* 24.8* 23.9*  MCV 91.7  --  93.5  --  92.7  --  93.5  --  92.1  --   --   PLT 158  --  143*  --  126*  --  156  --  188  --   --    < > = values in this interval not displayed.   Cardiac Enzymes: No results for input(s): CKTOTAL, CKMB, CKMBINDEX, TROPONINI in the last 168 hours. BNP: Invalid input(s): POCBNP CBG: No results for input(s): GLUCAP in the last 168 hours. D-Dimer No results for input(s): DDIMER in the last 72 hours. Hgb A1c No results for input(s): HGBA1C in the last 72 hours. Lipid Profile No results for input(s): CHOL, HDL, LDLCALC, TRIG, CHOLHDL, LDLDIRECT in the last 72 hours. Thyroid function studies No results for input(s): TSH, T4TOTAL, T3FREE, THYROIDAB in the last 72 hours.  Invalid input(s): FREET3 Anemia work up No results for input(s): VITAMINB12, FOLATE, FERRITIN, TIBC, IRON, RETICCTPCT in the last 72 hours. Urinalysis    Component Value Date/Time   COLORURINE YELLOW 07/23/2020 1949   APPEARANCEUR CLEAR 07/23/2020 1949   LABSPEC 1.035 (H) 07/23/2020 1949   PHURINE 6.0 07/23/2020 1949   GLUCOSEU NEGATIVE 07/23/2020 1949   HGBUR NEGATIVE 07/23/2020 1949   [place] NEGATIVE 07/23/2020 1949   KETONESUR 80 (A) 07/23/2020 1949   PROTEINUR NEGATIVE 07/23/2020 1949   NITRITE NEGATIVE 07/23/2020 1949   LEUKOCYTESUR NEGATIVE 07/23/2020 1949   Sepsis Labs Invalid input(s): PROCALCITONIN,  WBC,  LACTICIDVEN Microbiology Recent Results (from the past 240 hour(s))  Respiratory Panel by RT PCR (Flu A&B, Covid) - Nasopharyngeal Swab     Status: None   Collection Time: 07/23/20  8:30 PM   Specimen: Nasopharyngeal Swab  Result Value Ref Range Status   SARS  Coronavirus 2 by RT PCR NEGATIVE NEGATIVE Final    Comment: (NOTE)  SARS-CoV-2 target nucleic acids are NOT DETECTED.  The SARS-CoV-2 RNA is generally detectable in upper respiratoy specimens during the acute phase of infection. The lowest concentration of SARS-CoV-2 viral copies this assay can detect is 131 copies/mL. A negative result does not preclude SARS-Cov-2 infection and should not be used as the sole basis for treatment or other patient management decisions. A negative result may occur with  improper specimen collection/handling, submission of specimen other than nasopharyngeal swab, presence of viral mutation(s) within the areas targeted by this assay, and inadequate number of viral copies (<131 copies/mL). A negative result must be combined with clinical observations, patient history, and epidemiological information. The expected result is Negative.  Fact Sheet for Patients:  PinkCheek.be  Fact Sheet for Healthcare Providers:  GravelBags.it  This test is no t yet approved or cleared by the [place] FDA and  has been authorized for detection and/or diagnosis of SARS-CoV-2 by FDA under an Emergency Use Authorization (EUA). This EUA will remain  in effect (meaning this test can be used) for the duration of the COVID-19 declaration under Section 564(b)(1) of the Act, 21 U.S.C. section 360bbb-3(b)(1), unless the authorization is terminated or revoked sooner.     Influenza A by PCR NEGATIVE NEGATIVE Final   Influenza B by PCR NEGATIVE NEGATIVE Final    Comment: (NOTE) The Xpert Xpress SARS-CoV-2/FLU/RSV assay is intended as an aid in  the diagnosis of influenza from Nasopharyngeal swab specimens and  should not be used as a sole basis for treatment. Nasal washings and  aspirates are unacceptable for Xpert Xpress SARS-CoV-2/FLU/RSV  testing.  Fact Sheet for  Patients: PinkCheek.be  Fact Sheet for Healthcare Providers: GravelBags.it  This test is not yet approved or cleared by the [place] FDA and  has been authorized for detection and/or diagnosis of SARS-CoV-2 by  FDA under an Emergency Use Authorization (EUA). This EUA will remain  in effect (meaning this test can be used) for the duration of the  Covid-19 declaration under Section 564(b)(1) of the Act, 21  U.S.C. section 360bbb-3(b)(1), unless the authorization is  terminated or revoked. Performed at [facility], [place] [address] [place], [place] [address]    Time spent: 30 min  SIGNED:   Marylu Lund, MD  [facility] 07/29/2020, 11:08 AM  If 7PM-7AM, please contact night-coverage

## 2020-07-29 NOTE — Progress Notes (Signed)
Patient ID: MCKOY BHAKTA, male   DOB: [birth year], 59 y.o.   MRN: [id number]    Subjective: Pt doing well.  Pain controlled with decreased narcotic pain requirement.  Objective: Vital signs in last 24 hours: Temp:  [98.4 F (36.9 C)-98.9 F (37.2 C)] 98.4 F (36.9 C) (10/26 0557) Pulse Rate:  [81-82] 82 (10/26 0557) Resp:  [16-18] 18 (10/26 0557) BP: (115-135)/(72-75) 123/75 (10/26 0557) SpO2:  [85 %-100 %] 96 % (10/26 0557)  Intake/Output from previous day: 10/25 0701 - 10/26 0700 In: 1610 [P.O.:1210; IV Piggyback:400] Out: 0  Intake/Output this shift: No intake/output data recorded.  Physical Exam:  General: Alert and oriented GU: [place] CVAT  Lab Results: Recent Labs    07/28/20 0500 07/28/20 1642 07/29/20 0441  HGB 7.4* 8.3* 7.9*  HCT 22.0* 24.8* 23.9*   BMET Recent Labs    07/27/20 0613 07/28/20 0500  NA 132* 132*  K 3.8 3.5  CL 96* 95*  CO2 27 28  GLUCOSE 118* 125*  BUN 17 18  CREATININE 1.04 1.01  CALCIUM 8.5* 8.4*     Studies/Results: No results found.  Assessment/Plan: 1) Left perinephric hematoma due to trauma: Hgb stabilized.  Chancellor for discharge from GU standpoint.  Instructed patient to avoid strenuous activity for 1 month.  Dr. Diona Fanti to arrange outpatient follow up to repeat imaging.   LOS: 5 days   [name] 07/29/2020, 7:50 AM

## 2020-07-29 NOTE — Progress Notes (Signed)
Patient was given discharge instructions with spouse on speaker phone, and all questions were answered.  Patient was taken to main exit by wheelchair.

## 2020-10-16 ENCOUNTER — Other Ambulatory Visit: Payer: Self-pay | Admitting: Urology

## 2020-10-16 DIAGNOSIS — D49512 Neoplasm of unspecified behavior of left kidney: Secondary | ICD-10-CM

## 2020-10-24 ENCOUNTER — Other Ambulatory Visit: Payer: Self-pay | Admitting: Urology

## 2020-10-24 ENCOUNTER — Ambulatory Visit (HOSPITAL_COMMUNITY)
Admission: RE | Admit: 2020-10-24 | Discharge: 2020-10-24 | Disposition: A | Discharge status: Home / Self Care | Payer: 59 | Source: Ambulatory Visit | Attending: Urology | Admitting: Urology

## 2020-10-24 DIAGNOSIS — D49512 Neoplasm of unspecified behavior of left kidney: Secondary | ICD-10-CM | POA: Diagnosis not present

## 2020-10-24 MED ORDER — GADOBUTROL 1 MMOL/ML IV SOLN
7.0000 mL | Freq: Once | INTRAVENOUS | Status: AC | PRN
  Administered 2020-10-24: 7 mL via INTRAVENOUS

## 2020-11-06 NOTE — Progress Notes (Addendum)
COVID Vaccine Completed: x 3 Date COVID Vaccine completed:  12/21 Booster COVID vaccine manufacturer: [place]   PCP - Lottie Dawson, PA Cardiologist - N/A  Chest x-ray - N/A EKG - 07-23-20 in Epic Stress Test -  ECHO -  Cardiac Cath -  Pacemaker/ICD device last checked:  Sleep Study - N/A CPAP -   Fasting Blood Sugar - N/A Checks Blood Sugar _____ times a day  Blood Thinner Instructions:N/A Aspirin Instructions: Last Dose:  Anesthesia review: N/A  Patient denies shortness of breath, fever, cough and chest pain at PAT appointment  Activity:  Patient able to climb a flight of stairs and do housework independently.  Patient verbalized understanding of instructions that were given to them at the PAT appointment. Patient was also instructed that they will need to review over the PAT instructions again at home before surgery.

## 2020-11-06 NOTE — Patient Instructions (Addendum)
DUE TO COVID-19 ONLY ONE VISITOR IS ALLOWED TO COME WITH YOU AND STAY IN THE WAITING ROOM ONLY DURING PRE OP AND PROCEDURE.   IF YOU WILL BE ADMITTED INTO THE HOSPITAL YOU ARE ALLOWED ONE SUPPORT PERSON DURING VISITATION HOURS ONLY (10AM -8PM)   . The support person may change daily. . The support person must pass our screening, gel in and out, and wear a mask at all times, including in the patient's room. . Patients must also wear a mask when staff or their support person are in the room.   COVID SWAB TESTING MUST BE COMPLETED ON:  Monday, 11-10-20 @ 10:10 AM   [address] [place], [place] [address]  (Must self quarantine after testing. Follow instructions on handout.)        Your procedure is scheduled on:  Thursday, 11-13-20   Report to [facility] Main  Entrance    Report to admitting at 9:30 AM   Call this number if you have problems the morning of surgery [phone]   Do not eat food :After Midnight.   May have liquids until 8:30 AM day of surgery  CLEAR LIQUID DIET  Foods Allowed                                                                     Foods Excluded  Water, Black Coffee and tea, regular and decaf              liquids that you cannot  Plain Jell-O in any flavor  (No red)                                    see through such as: Fruit ices (not with fruit pulp)                                      milk, soups, orange juice              Iced Popsicles (No red)                                      All solid food                                   Apple juices Sports drinks like Gatorade (No red) Lightly seasoned clear broth or consume(fat free)      Sugar, honey syrupThe day of surgery:       Oral Hygiene is also important to reduce your risk of infection.                                    Remember - BRUSH YOUR TEETH THE MORNING OF SURGERY WITH YOUR REGULAR TOOTHPASTE   Do NOT smoke after Midnight   Take these medicines the morning of surgery with A  SIP OF WATER: None  You may not have any metal on your body including jewelry, and body piercings             Do not wear lotions, powders, perfumes/cologne, or deodorant             Men may shave face and neck.   Do not bring valuables to the hospital. [place].   Contacts, dentures or bridgework may not be worn into surgery.   Bring small overnight bag day of surgery.               Please read over the following fact sheets you were given: IF YOU HAVE QUESTIONS ABOUT YOUR PRE OP INSTRUCTIONS PLEASE CALL  [place] - Preparing for Surgery Before surgery, you can play an important role.  Because skin is not sterile, your skin needs to be as free of germs as possible.  You can reduce the number of germs on your skin by washing with CHG (chlorahexidine gluconate) soap before surgery.  CHG is an antiseptic cleaner which kills germs and bonds with the skin to continue killing germs even after washing. Please DO NOT use if you have an allergy to CHG or antibacterial soaps.  If your skin becomes reddened/irritated stop using the CHG and inform your nurse when you arrive at Short Stay. Do not shave (including legs and underarms) for at least 48 hours prior to the first CHG shower.  You may shave your face/neck.  Please follow these instructions carefully:  1.  Shower with CHG Soap the night before surgery and the  morning of surgery.  2.  If you choose to wash your hair, wash your hair first as usual with your normal  shampoo.  3.  After you shampoo, rinse your hair and body thoroughly to remove the shampoo.                             4.  Use CHG as you would any other liquid soap.  You can apply chg directly to the skin and wash.  Gently with a scrungie or clean washcloth.  5.  Apply the CHG Soap to your body ONLY FROM THE NECK DOWN.   Do   not use on face/ open                           Wound or open  sores. Avoid contact with eyes, ears mouth and   genitals (private parts).                       Wash face,  Genitals (private parts) with your normal soap.             6.  Wash thoroughly, paying special attention to the area where your    surgery  will be performed.  7.  Thoroughly rinse your body with warm water from the neck down.  8.  DO NOT shower/wash with your normal soap after using and rinsing off the CHG Soap.                9.  Pat yourself dry with a clean towel.            10.  Wear clean pajamas.            11.  Place clean sheets on  your bed the night of your first shower and do not  sleep with pets. Day of Surgery : Do not apply any lotions/deodorants the morning of surgery.  Please wear clean clothes to the hospital/surgery center.  FAILURE TO FOLLOW THESE INSTRUCTIONS MAY RESULT IN THE CANCELLATION OF YOUR SURGERY  PATIENT SIGNATURE_________________________________  NURSE SIGNATURE__________________________________  ________________________________________________________________________   Brandon Booker  An incentive spirometer is a tool that can help keep your lungs clear and active. This tool measures how well you are filling your lungs with each breath. Taking long deep breaths may help reverse or decrease the chance of developing breathing (pulmonary) problems (especially infection) following:  A long period of time when you are unable to move or be active. BEFORE THE PROCEDURE   If the spirometer includes an indicator to show your best effort, your nurse or respiratory therapist will set it to a desired goal.  If possible, sit up straight or lean slightly forward. Try not to slouch.  Hold the incentive spirometer in an upright position. INSTRUCTIONS FOR USE  1. Sit on the edge of your bed if possible, or sit up as far as you can in bed or on a chair. 2. Hold the incentive spirometer in an upright position. 3. Breathe out normally. 4. Place the  mouthpiece in your mouth and seal your lips tightly around it. 5. Breathe in slowly and as deeply as possible, raising the piston or the ball toward the top of the column. 6. Hold your breath for 3-5 seconds or for as long as possible. Allow the piston or ball to fall to the bottom of the column. 7. Remove the mouthpiece from your mouth and breathe out normally. 8. Rest for a few seconds and repeat Steps 1 through 7 at least 10 times every 1-2 hours when you are awake. Take your time and take a few normal breaths between deep breaths. 9. The spirometer may include an indicator to show your best effort. Use the indicator as a goal to work toward during each repetition. 10. After each set of 10 deep breaths, practice coughing to be sure your lungs are clear. If you have an incision (the cut made at the time of surgery), support your incision when coughing by placing a pillow or rolled up towels firmly against it. Once you are able to get out of bed, walk around indoors and cough well. You may stop using the incentive spirometer when instructed by your caregiver.  RISKS AND COMPLICATIONS  Take your time so you do not get dizzy or light-headed.  If you are in pain, you may need to take or ask for pain medication before doing incentive spirometry. It is harder to take a deep breath if you are having pain. AFTER USE  Rest and breathe slowly and easily.  It can be helpful to keep track of a log of your progress. Your caregiver can provide you with a simple table to help with this. If you are using the spirometer at home, follow these instructions: [place] IF:   You are having difficultly using the spirometer.  You have trouble using the spirometer as often as instructed.  Your pain medication is not giving enough relief while using the spirometer.  You develop fever of 100.5 F (38.1 C) or higher. SEEK IMMEDIATE MEDICAL CARE IF:   You cough up bloody sputum that had not been present  before.  You develop fever of 102 F (38.9 C) or greater.  You develop  worsening pain at or near the incision site. MAKE SURE YOU:   Understand these instructions.  Will watch your condition.  Will get help right away if you are not doing well or get worse. Document Released: 01/31/2007 Document Revised: 12/13/2011 Document Reviewed: 04/03/2007 ExitCare Patient Information 2014 Exit[employer], [place].   ________________________________________________________________________  WHAT IS A BLOOD TRANSFUSION? Blood Transfusion Information  A transfusion is the replacement of blood or some of its parts. Blood is made up of multiple cells which provide different functions.  Red blood cells carry oxygen and are used for blood loss replacement.  White blood cells fight against infection.  Platelets control bleeding.  Plasma helps clot blood.  Other blood products are available for specialized needs, such as hemophilia or other clotting disorders. BEFORE THE TRANSFUSION  Who gives blood for transfusions?   Healthy volunteers who are fully evaluated to make sure their blood is safe. This is blood bank blood. Transfusion therapy is the safest it has ever been in the practice of medicine. Before blood is taken from a donor, a complete history is taken to make sure that person has no history of diseases nor engages in risky social behavior (examples are intravenous drug use or sexual activity with multiple partners). The donor's travel history is screened to minimize risk of transmitting infections, such as malaria. The donated blood is tested for signs of infectious diseases, such as HIV and hepatitis. The blood is then tested to be sure it is compatible with you in order to minimize the chance of a transfusion reaction. If you or a relative donates blood, this is often done in anticipation of surgery and is not appropriate for emergency situations. It takes many days to process the donated  blood. RISKS AND COMPLICATIONS Although transfusion therapy is very safe and saves many lives, the main dangers of transfusion include:   Getting an infectious disease.  Developing a transfusion reaction. This is an allergic reaction to something in the blood you were given. Every precaution is taken to prevent this. The decision to have a blood transfusion has been considered carefully by your caregiver before blood is given. Blood is not given unless the benefits outweigh the risks. AFTER THE TRANSFUSION  Right after receiving a blood transfusion, you will usually feel much better and more energetic. This is especially true if your red blood cells have gotten low (anemic). The transfusion raises the level of the red blood cells which carry oxygen, and this usually causes an energy increase.  The nurse administering the transfusion will monitor you carefully for complications. HOME CARE INSTRUCTIONS  No special instructions are needed after a transfusion. You may find your energy is better. Speak with your caregiver about any limitations on activity for underlying diseases you may have. SEEK MEDICAL CARE IF:   Your condition is not improving after your transfusion.  You develop redness or irritation at the intravenous (IV) site. SEEK IMMEDIATE MEDICAL CARE IF:  Any of the following symptoms occur over the next 12 hours:  Shaking chills.  You have a temperature by mouth above 102 F (38.9 C), not controlled by medicine.  Chest, back, or muscle pain.  People around you feel you are not acting correctly or are confused.  Shortness of breath or difficulty breathing.  Dizziness and fainting.  You get a rash or develop hives.  You have a decrease in urine output.  Your urine turns a dark color or changes to pink, red, or brown. Any of the following  symptoms occur over the next 10 days:  You have a temperature by mouth above 102 F (38.9 C), not controlled by  medicine.  Shortness of breath.  Weakness after normal activity.  The white part of the eye turns yellow (jaundice).  You have a decrease in the amount of urine or are urinating less often.  Your urine turns a dark color or changes to pink, red, or brown. Document Released: 09/17/2000 Document Revised: 12/13/2011 Document Reviewed: 05/06/2008 [employer] Patient Information 2014 [place], [place].  _______________________________________________________________________

## 2020-11-07 ENCOUNTER — Encounter (HOSPITAL_COMMUNITY)
Admission: RE | Admit: 2020-11-07 | Discharge: 2020-11-07 | Disposition: A | Discharge status: Home / Self Care | Payer: 59 | Source: Ambulatory Visit | Attending: Urology | Admitting: Urology

## 2020-11-07 ENCOUNTER — Encounter (HOSPITAL_COMMUNITY): Payer: Self-pay

## 2020-11-07 ENCOUNTER — Other Ambulatory Visit: Payer: Self-pay

## 2020-11-07 DIAGNOSIS — Z01812 Encounter for preprocedural laboratory examination: Secondary | ICD-10-CM | POA: Insufficient documentation

## 2020-11-07 HISTORY — DX: Other specified postprocedural states: R11.2

## 2020-11-07 HISTORY — DX: Other specified postprocedural states: Z98.890

## 2020-11-07 HISTORY — DX: Other specified disorders of kidney and ureter: N28.89

## 2020-11-07 LAB — CBC
HCT: 45.1 % (ref 39.0–52.0)
Hemoglobin: 14.8 g/dL (ref 13.0–17.0)
MCH: 29.2 pg (ref 26.0–34.0)
MCHC: 32.8 g/dL (ref 30.0–36.0)
MCV: 89 fL (ref 80.0–100.0)
Platelets: 227 10*3/uL (ref 150–400)
RBC: 5.07 MIL/uL (ref 4.22–5.81)
RDW: 12.7 % (ref 11.5–15.5)
WBC: 4.4 10*3/uL (ref 4.0–10.5)
nRBC: 0 % (ref 0.0–0.2)

## 2020-11-07 LAB — BASIC METABOLIC PANEL
Anion gap: 9 (ref 5–15)
BUN: 24 mg/dL — ABNORMAL HIGH (ref 6–20)
CO2: 29 mmol/L (ref 22–32)
Calcium: 9.7 mg/dL (ref 8.9–10.3)
Chloride: 103 mmol/L (ref 98–111)
Creatinine, Ser: 0.88 mg/dL (ref 0.61–1.24)
GFR, Estimated: >60 mL/min (ref >60)
Glucose, Bld: 97 mg/dL (ref 70–99)
Potassium: 5 mmol/L (ref 3.5–5.1)
Sodium: 141 mmol/L (ref 135–145)

## 2020-11-07 NOTE — Progress Notes (Deleted)
CBC results sent to Dr. Alinda Money to review.

## 2020-11-10 ENCOUNTER — Other Ambulatory Visit (HOSPITAL_COMMUNITY)
Admission: RE | Admit: 2020-11-10 | Discharge: 2020-11-10 | Disposition: A | Discharge status: Home / Self Care | Payer: 59 | Source: Ambulatory Visit | Attending: Urology | Admitting: Urology

## 2020-11-10 DIAGNOSIS — Z20822 Contact with and (suspected) exposure to covid-19: Secondary | ICD-10-CM | POA: Insufficient documentation

## 2020-11-10 DIAGNOSIS — Z01812 Encounter for preprocedural laboratory examination: Secondary | ICD-10-CM | POA: Insufficient documentation

## 2020-11-11 LAB — SARS CORONAVIRUS 2 (TAT 6-24 HRS): SARS Coronavirus 2: NEGATIVE
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
## 2020-11-13 ENCOUNTER — Other Ambulatory Visit (HOSPITAL_COMMUNITY): Payer: Self-pay | Admitting: Physician Assistant

## 2020-11-13 ENCOUNTER — Encounter (HOSPITAL_COMMUNITY)
Admission: RE | Disposition: A | Discharge status: Home / Self Care | Payer: Self-pay | Source: Home / Self Care | Attending: Urology

## 2020-11-13 ENCOUNTER — Inpatient Hospital Stay (HOSPITAL_COMMUNITY): Payer: 59 | Admitting: Certified Registered Nurse Anesthetist

## 2020-11-13 ENCOUNTER — Other Ambulatory Visit: Payer: Self-pay

## 2020-11-13 ENCOUNTER — Inpatient Hospital Stay (HOSPITAL_COMMUNITY)
Admission: RE | Admit: 2020-11-13 | Discharge: 2020-11-18 | DRG: 657 | Disposition: A | Discharge status: Home / Self Care | Payer: 59 | Attending: Urology | Admitting: Urology

## 2020-11-13 ENCOUNTER — Encounter (HOSPITAL_COMMUNITY): Payer: Self-pay | Admitting: Urology

## 2020-11-13 ENCOUNTER — Inpatient Hospital Stay (HOSPITAL_COMMUNITY): Payer: 59 | Admitting: Physician Assistant

## 2020-11-13 DIAGNOSIS — Z20822 Contact with and (suspected) exposure to covid-19: Secondary | ICD-10-CM | POA: Diagnosis present

## 2020-11-13 DIAGNOSIS — D49512 Neoplasm of unspecified behavior of left kidney: Secondary | ICD-10-CM | POA: Diagnosis present

## 2020-11-13 DIAGNOSIS — Z8042 Family history of malignant neoplasm of prostate: Secondary | ICD-10-CM

## 2020-11-13 DIAGNOSIS — K567 Ileus, unspecified: Secondary | ICD-10-CM | POA: Diagnosis not present

## 2020-11-13 DIAGNOSIS — C642 Malignant neoplasm of left kidney, except renal pelvis: Secondary | ICD-10-CM | POA: Diagnosis present

## 2020-11-13 DIAGNOSIS — Z4659 Encounter for fitting and adjustment of other gastrointestinal appliance and device: Secondary | ICD-10-CM

## 2020-11-13 DIAGNOSIS — K9189 Other postprocedural complications and disorders of digestive system: Secondary | ICD-10-CM | POA: Diagnosis not present

## 2020-11-13 DIAGNOSIS — J3801 Paralysis of vocal cords and larynx, unilateral: Secondary | ICD-10-CM | POA: Diagnosis present

## 2020-11-13 DIAGNOSIS — Z978 Presence of other specified devices: Secondary | ICD-10-CM

## 2020-11-13 HISTORY — PX: LAPAROSCOPIC NEPHRECTOMY: SHX1930

## 2020-11-13 LAB — BASIC METABOLIC PANEL
Anion gap: 9 (ref 5–15)
BUN: 14 mg/dL (ref 6–20)
CO2: 26 mmol/L (ref 22–32)
Calcium: 8.7 mg/dL — ABNORMAL LOW (ref 8.9–10.3)
Chloride: 102 mmol/L (ref 98–111)
Creatinine, Ser: 1.27 mg/dL — ABNORMAL HIGH (ref 0.61–1.24)
GFR, Estimated: >60 mL/min (ref >60)
Glucose, Bld: 181 mg/dL — ABNORMAL HIGH (ref 70–99)
Potassium: 4.3 mmol/L (ref 3.5–5.1)
Sodium: 137 mmol/L (ref 135–145)

## 2020-11-13 LAB — TYPE AND SCREEN
ABO/RH(D): A POS
Antibody Screen: NEGATIVE

## 2020-11-13 LAB — HEMOGLOBIN AND HEMATOCRIT, BLOOD
HCT: 42.6 % (ref 39.0–52.0)
Hemoglobin: 13.8 g/dL (ref 13.0–17.0)

## 2020-11-13 SURGERY — NEPHRECTOMY, RADICAL, LAPAROSCOPIC, ADULT
Anesthesia: General | Laterality: Left

## 2020-11-13 MED ORDER — ONDANSETRON HCL 4 MG/2ML IJ SOLN
INTRAMUSCULAR | Status: DC | PRN
Start: 2020-11-13 — End: 2020-11-13
  Administered 2020-11-13: 4 mg via INTRAVENOUS

## 2020-11-13 MED ORDER — PROPOFOL 10 MG/ML IV BOLUS
INTRAVENOUS | Status: DC | PRN
  Administered 2020-11-13: 200 mg via INTRAVENOUS

## 2020-11-13 MED ORDER — AMISULPRIDE (ANTIEMETIC) 5 MG/2ML IV SOLN: 10.0000 mg | Freq: Once | INTRAVENOUS | Status: DC | PRN

## 2020-11-13 MED ORDER — MIDAZOLAM HCL 5 MG/5ML IJ SOLN
INTRAMUSCULAR | Status: DC | PRN
  Administered 2020-11-13: 2 mg via INTRAVENOUS

## 2020-11-13 MED ORDER — FENTANYL CITRATE (PF) 100 MCG/2ML IJ SOLN
25.0000 ug | INTRAMUSCULAR | Status: DC | PRN
  Administered 2020-11-13 (×2): 50 ug via INTRAVENOUS

## 2020-11-13 MED ORDER — HYDROMORPHONE HCL 1 MG/ML IJ SOLN
INTRAMUSCULAR | Status: AC
  Filled 2020-11-13: qty 1

## 2020-11-13 MED ORDER — HYDROMORPHONE HCL 1 MG/ML IJ SOLN: 0.5000 mg | INTRAMUSCULAR | Status: DC | PRN

## 2020-11-13 MED ORDER — FENTANYL CITRATE (PF) 100 MCG/2ML IJ SOLN
INTRAMUSCULAR | Status: AC
  Administered 2020-11-13: 50 ug via INTRAVENOUS
  Filled 2020-11-13: qty 2

## 2020-11-13 MED ORDER — SCOPOLAMINE 1 MG/3DAYS TD PT72
MEDICATED_PATCH | TRANSDERMAL | Status: DC | PRN
  Administered 2020-11-13: 1 via TRANSDERMAL

## 2020-11-13 MED ORDER — OXYCODONE HCL 5 MG PO TABS
ORAL_TABLET | ORAL | Status: AC
  Administered 2020-11-13: 5 mg via ORAL
  Filled 2020-11-13: qty 1

## 2020-11-13 MED ORDER — ROCURONIUM BROMIDE 10 MG/ML (PF) SYRINGE
PREFILLED_SYRINGE | INTRAVENOUS | Status: AC
  Filled 2020-11-13: qty 10

## 2020-11-13 MED ORDER — DEXTROSE-NACL 5-0.45 % IV SOLN: INTRAVENOUS | Status: DC

## 2020-11-13 MED ORDER — MIDAZOLAM HCL 2 MG/2ML IJ SOLN
INTRAMUSCULAR | Status: AC
  Filled 2020-11-13: qty 2

## 2020-11-13 MED ORDER — TRAMADOL HCL 50 MG PO TABS
50.0000 mg | ORAL_TABLET | Freq: Four times a day (QID) | ORAL | 0 refills | Status: DC | PRN
Start: 2020-11-13 — End: 2021-08-20

## 2020-11-13 MED ORDER — DIPHENHYDRAMINE HCL 12.5 MG/5ML PO ELIX
12.5000 mg | ORAL_SOLUTION | Freq: Four times a day (QID) | ORAL | Status: DC | PRN
Start: 2020-11-13 — End: 2020-11-19

## 2020-11-13 MED ORDER — SUGAMMADEX SODIUM 200 MG/2ML IV SOLN
INTRAVENOUS | Status: DC | PRN
  Administered 2020-11-13: 150 mg via INTRAVENOUS

## 2020-11-13 MED ORDER — MAGNESIUM CITRATE PO SOLN
0.5000 | Freq: Once | ORAL | Status: DC
  Filled 2020-11-13: qty 296

## 2020-11-13 MED ORDER — DOCUSATE SODIUM 100 MG PO CAPS
100.0000 mg | ORAL_CAPSULE | Freq: Two times a day (BID) | ORAL | Status: DC
  Administered 2020-11-13 – 2020-11-18 (×4): 100 mg via ORAL
  Filled 2020-11-13 (×5): qty 1

## 2020-11-13 MED ORDER — CHLORHEXIDINE GLUCONATE CLOTH 2 % EX PADS: 6.0000 | MEDICATED_PAD | Freq: Every day | CUTANEOUS | Status: DC

## 2020-11-13 MED ORDER — SCOPOLAMINE 1 MG/3DAYS TD PT72
MEDICATED_PATCH | TRANSDERMAL | Status: AC
  Filled 2020-11-13: qty 1

## 2020-11-13 MED ORDER — GLYCOPYRROLATE 0.2 MG/ML IJ SOLN
INTRAMUSCULAR | Status: DC | PRN
  Administered 2020-11-13: .1 mg via INTRAVENOUS

## 2020-11-13 MED ORDER — SODIUM CHLORIDE (PF) 0.9 % IJ SOLN
INTRAMUSCULAR | Status: AC
  Filled 2020-11-13: qty 10

## 2020-11-13 MED ORDER — ONDANSETRON HCL 4 MG/2ML IJ SOLN
INTRAMUSCULAR | Status: AC
  Filled 2020-11-13: qty 2

## 2020-11-13 MED ORDER — ONDANSETRON HCL 4 MG/2ML IJ SOLN: 4.0000 mg | Freq: Once | INTRAMUSCULAR | Status: DC | PRN

## 2020-11-13 MED ORDER — LACTATED RINGERS IV SOLN: INTRAVENOUS | Status: DC | PRN

## 2020-11-13 MED ORDER — OXYCODONE HCL 5 MG PO TABS: 5.0000 mg | ORAL_TABLET | Freq: Once | ORAL | Status: AC | PRN

## 2020-11-13 MED ORDER — SODIUM CHLORIDE (PF) 0.9 % IJ SOLN
INTRAMUSCULAR | Status: DC | PRN
  Administered 2020-11-13: 20 mL

## 2020-11-13 MED ORDER — HYDROMORPHONE HCL 1 MG/ML IJ SOLN
INTRAMUSCULAR | Status: DC | PRN
  Administered 2020-11-13 (×2): .2 mg via INTRAVENOUS
  Administered 2020-11-13 (×3): .4 mg via INTRAVENOUS

## 2020-11-13 MED ORDER — PHENYLEPHRINE HCL-NACL 10-0.9 MG/250ML-% IV SOLN
INTRAVENOUS | Status: DC | PRN
  Administered 2020-11-13: 35 ug/min via INTRAVENOUS

## 2020-11-13 MED ORDER — BUPIVACAINE LIPOSOME 1.3 % IJ SUSP
20.0000 mL | Freq: Once | INTRAMUSCULAR | Status: AC
  Administered 2020-11-13: 20 mL
  Filled 2020-11-13: qty 20

## 2020-11-13 MED ORDER — DEXAMETHASONE SODIUM PHOSPHATE 10 MG/ML IJ SOLN
INTRAMUSCULAR | Status: AC
  Filled 2020-11-13: qty 1

## 2020-11-13 MED ORDER — STERILE WATER FOR IRRIGATION IR SOLN
Status: DC | PRN
  Administered 2020-11-13: 1000 mL

## 2020-11-13 MED ORDER — ORAL CARE MOUTH RINSE: 15.0000 mL | Freq: Once | OROMUCOSAL | Status: AC

## 2020-11-13 MED ORDER — HYDROMORPHONE HCL 2 MG/ML IJ SOLN
INTRAMUSCULAR | Status: AC
  Filled 2020-11-13: qty 1

## 2020-11-13 MED ORDER — FENTANYL CITRATE (PF) 100 MCG/2ML IJ SOLN
INTRAMUSCULAR | Status: DC | PRN
  Administered 2020-11-13: 100 ug via INTRAVENOUS
  Administered 2020-11-13 (×3): 50 ug via INTRAVENOUS

## 2020-11-13 MED ORDER — SODIUM CHLORIDE (PF) 0.9 % IJ SOLN
INTRAMUSCULAR | Status: AC
  Filled 2020-11-13: qty 20

## 2020-11-13 MED ORDER — HYDROMORPHONE HCL 1 MG/ML IJ SOLN
0.2500 mg | INTRAMUSCULAR | Status: DC | PRN
  Administered 2020-11-13: 0.5 mg via INTRAVENOUS

## 2020-11-13 MED ORDER — LIDOCAINE HCL (PF) 2 % IJ SOLN
INTRAMUSCULAR | Status: AC
  Filled 2020-11-13: qty 5

## 2020-11-13 MED ORDER — HYDROMORPHONE HCL 1 MG/ML IJ SOLN
INTRAMUSCULAR | Status: AC
  Administered 2020-11-13: 0.5 mg via INTRAVENOUS
  Filled 2020-11-13: qty 1

## 2020-11-13 MED ORDER — OXYCODONE HCL 5 MG PO TABS
5.0000 mg | ORAL_TABLET | ORAL | Status: DC | PRN
  Administered 2020-11-13 – 2020-11-14 (×2): 5 mg via ORAL
  Filled 2020-11-13 (×2): qty 2
  Filled 2020-11-13: qty 1

## 2020-11-13 MED ORDER — PROPOFOL 10 MG/ML IV BOLUS
INTRAVENOUS | Status: AC
  Filled 2020-11-13: qty 20

## 2020-11-13 MED ORDER — CEFAZOLIN SODIUM-DEXTROSE 2-4 GM/100ML-% IV SOLN
2.0000 g | Freq: Once | INTRAVENOUS | Status: AC
  Administered 2020-11-13: 2 g via INTRAVENOUS
  Filled 2020-11-13: qty 100

## 2020-11-13 MED ORDER — ACETAMINOPHEN 10 MG/ML IV SOLN
1000.0000 mg | Freq: Four times a day (QID) | INTRAVENOUS | Status: DC
  Administered 2020-11-13 – 2020-11-14 (×3): 1000 mg via INTRAVENOUS
  Filled 2020-11-13 (×3): qty 100

## 2020-11-13 MED ORDER — CEFAZOLIN SODIUM-DEXTROSE 1-4 GM/50ML-% IV SOLN
1.0000 g | Freq: Three times a day (TID) | INTRAVENOUS | Status: AC
  Administered 2020-11-13 – 2020-11-14 (×2): 1 g via INTRAVENOUS
  Filled 2020-11-13 (×2): qty 50

## 2020-11-13 MED ORDER — DEXAMETHASONE SODIUM PHOSPHATE 10 MG/ML IJ SOLN
INTRAMUSCULAR | Status: DC | PRN
  Administered 2020-11-13: 10 mg via INTRAVENOUS

## 2020-11-13 MED ORDER — FENTANYL CITRATE (PF) 100 MCG/2ML IJ SOLN
INTRAMUSCULAR | Status: AC
  Filled 2020-11-13: qty 2

## 2020-11-13 MED ORDER — ROCURONIUM BROMIDE 10 MG/ML (PF) SYRINGE
PREFILLED_SYRINGE | INTRAVENOUS | Status: DC | PRN
  Administered 2020-11-13 (×5): 20 mg via INTRAVENOUS
  Administered 2020-11-13: 70 mg via INTRAVENOUS
  Administered 2020-11-13: 20 mg via INTRAVENOUS
  Administered 2020-11-13: 10 mg via INTRAVENOUS
  Administered 2020-11-13: 20 mg via INTRAVENOUS

## 2020-11-13 MED ORDER — OXYCODONE HCL 5 MG/5ML PO SOLN: 5.0000 mg | Freq: Once | ORAL | Status: AC | PRN

## 2020-11-13 MED ORDER — DIPHENHYDRAMINE HCL 50 MG/ML IJ SOLN: 12.5000 mg | Freq: Four times a day (QID) | INTRAMUSCULAR | Status: DC | PRN

## 2020-11-13 MED ORDER — CHLORHEXIDINE GLUCONATE 0.12 % MT SOLN
15.0000 mL | Freq: Once | OROMUCOSAL | Status: AC
  Administered 2020-11-13: 15 mL via OROMUCOSAL

## 2020-11-13 MED ORDER — FENTANYL CITRATE (PF) 250 MCG/5ML IJ SOLN
INTRAMUSCULAR | Status: AC
  Filled 2020-11-13: qty 5

## 2020-11-13 MED ORDER — ONDANSETRON HCL 4 MG/2ML IJ SOLN
4.0000 mg | INTRAMUSCULAR | Status: DC | PRN
  Administered 2020-11-15 – 2020-11-18 (×5): 4 mg via INTRAVENOUS
  Filled 2020-11-13 (×5): qty 2

## 2020-11-13 MED ORDER — LACTATED RINGERS IV SOLN: INTRAVENOUS | Status: DC

## 2020-11-13 MED ORDER — LACTATED RINGERS IR SOLN
Status: DC | PRN
  Administered 2020-11-13: 1000 mL

## 2020-11-13 MED ORDER — LIDOCAINE 2% (20 MG/ML) 5 ML SYRINGE
INTRAMUSCULAR | Status: DC | PRN
  Administered 2020-11-13: 100 mg via INTRAVENOUS

## 2020-11-13 MED FILL — traMADol HCL 50 MG TABS: 50 | 6 days supply | Qty: 20 | Fill #0

## 2020-11-13 SURGICAL SUPPLY — 51 items
ADH SKN CLS APL DERMABOND .7 (GAUZE/BANDAGES/DRESSINGS) ×1
AGENT HMST KT MTR STRL THRMB (HEMOSTASIS) ×1
APL PRP STRL LF DISP 70% ISPRP (MISCELLANEOUS) ×1
BAG LAPAROSCOPIC 12 15 PORT 16 (BASKET) ×1 IMPLANT
BAG RETRIEVAL 12/15 (BASKET)
BAG SPEC THK2 15X12 ZIP CLS (MISCELLANEOUS) ×1
BAG ZIPLOCK 12X15 (MISCELLANEOUS) ×2 IMPLANT
BLADE EXTENDED COATED 6.5IN (ELECTRODE) IMPLANT
CHLORAPREP W/TINT 26 (MISCELLANEOUS) ×2 IMPLANT
CLIP VESOLOCK LG 6/CT PURPLE (CLIP) ×2 IMPLANT
CLIP VESOLOCK MED LG 6/CT (CLIP) ×2 IMPLANT
CLIP VESOLOCK XL 6/CT (CLIP) IMPLANT
COVER SURGICAL LIGHT HANDLE (MISCELLANEOUS) ×2 IMPLANT
COVER WAND RF STERILE (DRAPES) IMPLANT
CUTTER FLEX LINEAR 45M (STAPLE) ×1 IMPLANT
DERMABOND ADVANCED (GAUZE/BANDAGES/DRESSINGS) ×1
DERMABOND ADVANCED .7 DNX12 (GAUZE/BANDAGES/DRESSINGS) ×1 IMPLANT
DRAPE INCISE IOBAN 66X45 STRL (DRAPES) ×2 IMPLANT
DRAPE LAPAROSCOPIC ABDOMINAL (DRAPES) IMPLANT
DRAPE WARM FLUID 44X44 (DRAPES) IMPLANT
ELECT PENCIL ROCKER SW 15FT (MISCELLANEOUS) ×2 IMPLANT
ELECT REM PT RETURN 15FT ADLT (MISCELLANEOUS) ×2 IMPLANT
GLOVE SURG ENC MOIS LTX SZ6.5 (GLOVE) ×2 IMPLANT
GLOVE SURG ENC TEXT LTX SZ7.5 (GLOVE) ×2 IMPLANT
GOWN STRL REUS W/TWL LRG LVL3 (GOWN DISPOSABLE) ×4 IMPLANT
GRASPER SUT TROCAR 14GX15 (MISCELLANEOUS) ×1 IMPLANT
HEMOSTAT SURGICEL 4X8 (HEMOSTASIS) ×1 IMPLANT
HOLDER FOLEY CATH W/STRAP (MISCELLANEOUS) ×2 IMPLANT
IRRIG SUCT STRYKERFLOW 2 WTIP (MISCELLANEOUS) ×2
IRRIGATION SUCT STRKRFLW 2 WTP (MISCELLANEOUS) ×1 IMPLANT
KIT BASIN OR (CUSTOM PROCEDURE TRAY) ×2 IMPLANT
KIT TURNOVER KIT A (KITS) ×2 IMPLANT
POUCH ENDO CATCH II 15MM (MISCELLANEOUS) ×1 IMPLANT
RELOAD 45 VASCULAR/THIN (ENDOMECHANICALS) ×4 IMPLANT
RELOAD STAPLE 45 2.5 WHT GRN (ENDOMECHANICALS) IMPLANT
SCISSORS LAP 5X35 DISP (ENDOMECHANICALS) IMPLANT
SET TUBE SMOKE EVAC HIGH FLOW (TUBING) ×1 IMPLANT
SHEARS HARMONIC ACE PLUS 36CM (ENDOMECHANICALS) ×2 IMPLANT
SURGIFLO W/THROMBIN 8M KIT (HEMOSTASIS) ×1 IMPLANT
SUT MNCRL AB 4-0 PS2 18 (SUTURE) ×4 IMPLANT
SUT PDS AB 1 CTX 36 (SUTURE) ×4 IMPLANT
SUT VIC AB 2-0 SH 27 (SUTURE)
SUT VIC AB 2-0 SH 27X BRD (SUTURE) IMPLANT
SUT VICRYL 0 UR6 27IN ABS (SUTURE) ×2 IMPLANT
TOWEL OR 17X26 10 PK STRL BLUE (TOWEL DISPOSABLE) ×2 IMPLANT
TOWEL OR NON WOVEN STRL DISP B (DISPOSABLE) ×2 IMPLANT
TRAY FOLEY MTR SLVR 16FR STAT (SET/KITS/TRAYS/PACK) ×2 IMPLANT
TRAY LAPAROSCOPIC (CUSTOM PROCEDURE TRAY) ×2 IMPLANT
TROCAR BLADELESS OPT 5 100 (ENDOMECHANICALS) ×2 IMPLANT
TROCAR XCEL 12X100 BLDLESS (ENDOMECHANICALS) ×2 IMPLANT
TROCAR XCEL BLUNT TIP 100MML (ENDOMECHANICALS) ×2 IMPLANT

## 2020-11-13 NOTE — Anesthesia Preprocedure Evaluation (Addendum)
Anesthesia Evaluation  Patient identified by MRN, date of birth, ID band Patient awake    Reviewed: Allergy & Precautions, NPO status , Patient's Chart, lab work & pertinent test results  History of Anesthesia Complications (+) PONVNegative for: history of anesthetic complications  Airway Mallampati: II  TM Distance: >3 FB Neck ROM: Full    Dental  (+) Teeth Intact   Pulmonary neg pulmonary ROS,    Pulmonary exam normal        Cardiovascular negative cardio ROS Normal cardiovascular exam     Neuro/Psych negative neurological ROS  negative psych ROS   GI/Hepatic negative GI ROS, Neg liver ROS,   Endo/Other  negative endocrine ROS  Renal/GU Renal disease (left renal mass)  negative genitourinary   Musculoskeletal negative musculoskeletal ROS (+)   Abdominal   Peds  Hematology negative hematology ROS (+)   Anesthesia Other Findings  H/o unilateral vocal cord paralysis w/ associated hoarse voice and dysphagia. No history of respiratory complications.  Reproductive/Obstetrics                            Anesthesia Physical Anesthesia Plan  ASA: II  Anesthesia Plan: General   Post-op Pain Management:    Induction: Intravenous  PONV Risk Score and Plan: 4 or greater and Ondansetron, Dexamethasone, Treatment may vary due to age or medical condition, Midazolam and Scopolamine patch - Pre-op  Airway Management Planned: Oral ETT  Additional Equipment: None  Intra-op Plan:   Post-operative Plan: Extubation in OR  Informed Consent: I have reviewed the patients History and Physical, chart, labs and discussed the procedure including the risks, benefits and alternatives for the proposed anesthesia with the patient or authorized representative who has indicated his/her understanding and acceptance.     Dental advisory given  Plan Discussed with:   Anesthesia Plan Comments:          Anesthesia Quick Evaluation

## 2020-11-13 NOTE — Anesthesia Postprocedure Evaluation (Signed)
Anesthesia Post Note  Patient: Brandon Booker  Procedure(s) Performed: LAPAROSCOPIC RADICAL NEPHRECTOMY (Left )     Patient location during evaluation: PACU Anesthesia Type: General Level of consciousness: awake and alert Pain management: pain level controlled Vital Signs Assessment: post-procedure vital signs reviewed and stable Respiratory status: spontaneous breathing, nonlabored ventilation and respiratory function stable Cardiovascular status: blood pressure returned to baseline and stable Postop Assessment: no apparent nausea or vomiting Anesthetic complications: no   No complications documented.  Last Vitals:  Vitals:   11/13/20 1630 11/13/20 1705  BP: (!) 148/81 (!) 143/92  Pulse: 68 86  Resp: (!) 9 13  Temp:  36.9 C  SpO2: 100% 100%    Last Pain:  Vitals:   11/13/20 1705  TempSrc: Oral  PainSc: 0-No pain                 Lidia Collum

## 2020-11-13 NOTE — Discharge Instructions (Signed)

## 2020-11-13 NOTE — Progress Notes (Signed)
Patient ID: Brandon Booker, male   DOB: 1961/09/02, 60 y.o.   MRN: [id number]  Post-op note  Subjective: The patient is doing well.  No complaints.  Objective: Vital signs in last 24 hours: Temp:  [97.7 F (36.5 C)-97.8 F (36.6 C)] 97.8 F (36.6 C) (02/10 1500) Pulse Rate:  [68-93] 68 (02/10 1630) Resp:  [9-19] 9 (02/10 1630) BP: (134-152)/(66-94) 148/81 (02/10 1630) SpO2:  [100 %] 100 % (02/10 1630) Weight:  [70.3 kg] 70.3 kg (02/10 0950)  Intake/Output from previous day: No intake/output data recorded. Intake/Output this shift: Total I/O In: 2500 [I.V.:2400; IV Piggyback:100] Out: 400 [Urine:200; Blood:200]  Physical Exam:  General: Alert and oriented. Abdomen: Soft, Nondistended. Incisions: Clean and dry.  Lab Results: Recent Labs    11/13/20 1526  HGB 13.8  HCT 42.6    Assessment/Plan: POD#0   1) Continue to monitor, ambulate, IS   Pryor Curia. MD   LOS: 0 days   [name] 11/13/2020, 4:50 PM

## 2020-11-13 NOTE — Transfer of Care (Signed)
Immediate Anesthesia Transfer of Care Note  Patient: Brandon Booker  Procedure(s) Performed: LAPAROSCOPIC RADICAL NEPHRECTOMY (Left )  Patient Location: PACU  Anesthesia Type:General  Level of Consciousness: awake, alert  and oriented  Airway & Oxygen Therapy: Patient Spontanous Breathing and Patient connected to face mask oxygen  Post-op Assessment: Report given to RN, Post -op Vital signs reviewed and stable and Patient moving all extremities X 4  Post vital signs: Reviewed and stable  Last Vitals:  Vitals Value Taken Time  BP 134/66 11/13/20 1501  Temp    Pulse 83 11/13/20 1503  Resp 21 11/13/20 1503  SpO2 100 % 11/13/20 1503  Vitals shown include unvalidated device data.  Last Pain:  Vitals:   11/13/20 1001  TempSrc:   PainSc: 0-No pain         Complications: No complications documented.
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
## 2020-11-13 NOTE — Anesthesia Procedure Notes (Signed)
Procedure Name: Intubation Date/Time: 11/13/2020 11:07 AM Performed by: Maxwell Caul, CRNA Pre-anesthesia Checklist: Patient identified, Emergency Drugs available, Suction available and Patient being monitored Patient Re-evaluated:Patient Re-evaluated prior to induction Oxygen Delivery Method: Circle system utilized Preoxygenation: Pre-oxygenation with 100% oxygen Induction Type: IV induction Ventilation: Mask ventilation without difficulty Laryngoscope Size: Mac and 4 Grade View: Grade I Tube type: Oral Tube size: 7.0 mm Number of attempts: 1 Airway Equipment and Method: Stylet Placement Confirmation: ETT inserted through vocal cords under direct vision,  positive ETCO2 and breath sounds checked- equal and bilateral Secured at: 21 cm Tube secured with: Tape Dental Injury: Teeth and Oropharynx as per pre-operative assessment

## 2020-11-14 ENCOUNTER — Encounter (HOSPITAL_COMMUNITY): Payer: Self-pay | Admitting: Urology

## 2020-11-14 LAB — BASIC METABOLIC PANEL
Anion gap: 8 (ref 5–15)
BUN: 13 mg/dL (ref 6–20)
CO2: 26 mmol/L (ref 22–32)
Calcium: 8.7 mg/dL — ABNORMAL LOW (ref 8.9–10.3)
Chloride: 102 mmol/L (ref 98–111)
Creatinine, Ser: 1.33 mg/dL — ABNORMAL HIGH (ref 0.61–1.24)
GFR, Estimated: >60 mL/min (ref >60)
Glucose, Bld: 111 mg/dL — ABNORMAL HIGH (ref 70–99)
Potassium: 4 mmol/L (ref 3.5–5.1)
Sodium: 136 mmol/L (ref 135–145)

## 2020-11-14 LAB — HEMOGLOBIN AND HEMATOCRIT, BLOOD
HCT: 37 % — ABNORMAL LOW (ref 39.0–52.0)
Hemoglobin: 12.3 g/dL — ABNORMAL LOW (ref 13.0–17.0)

## 2020-11-14 MED ORDER — BISACODYL 10 MG RE SUPP
10.0000 mg | Freq: Once | RECTAL | Status: AC
  Administered 2020-11-14: 10 mg via RECTAL
  Filled 2020-11-14: qty 1

## 2020-11-14 MED ORDER — METOCLOPRAMIDE HCL 5 MG/ML IJ SOLN
5.0000 mg | Freq: Four times a day (QID) | INTRAMUSCULAR | Status: DC
  Administered 2020-11-14 – 2020-11-18 (×17): 5 mg via INTRAVENOUS
  Filled 2020-11-14 (×17): qty 2

## 2020-11-14 MED ORDER — ORAL CARE MOUTH RINSE
15.0000 mL | Freq: Two times a day (BID) | OROMUCOSAL | Status: DC
  Administered 2020-11-14 – 2020-11-18 (×8): 15 mL via OROMUCOSAL

## 2020-11-14 MED ORDER — TRAMADOL HCL 50 MG PO TABS
50.0000 mg | ORAL_TABLET | Freq: Four times a day (QID) | ORAL | Status: DC | PRN
  Administered 2020-11-14 (×2): 50 mg via ORAL
  Administered 2020-11-14 – 2020-11-15 (×2): 100 mg via ORAL
  Filled 2020-11-14: qty 1
  Filled 2020-11-14 (×3): qty 2
  Filled 2020-11-14: qty 1

## 2020-11-14 MED ORDER — DOCUSATE SODIUM 100 MG PO CAPS
100.0000 mg | ORAL_CAPSULE | Freq: Two times a day (BID) | ORAL | Status: DC
  Filled 2020-11-14: qty 1

## 2020-11-14 MED ORDER — HYDROMORPHONE HCL 1 MG/ML IJ SOLN
1.0000 mg | Freq: Once | INTRAMUSCULAR | Status: AC
  Administered 2020-11-14: 1 mg via INTRAVENOUS
  Filled 2020-11-14: qty 1

## 2020-11-14 MED ORDER — SODIUM CHLORIDE 0.9 % IV SOLN
INTRAVENOUS | Status: DC | PRN
  Administered 2020-11-14 (×2): 250 mL via INTRAVENOUS

## 2020-11-14 MED ORDER — ACETAMINOPHEN 325 MG PO TABS
650.0000 mg | ORAL_TABLET | Freq: Four times a day (QID) | ORAL | Status: DC | PRN
  Administered 2020-11-14 – 2020-11-15 (×3): 650 mg via ORAL
  Filled 2020-11-14 (×3): qty 2

## 2020-11-14 MED ORDER — HYDROMORPHONE HCL 1 MG/ML IJ SOLN
0.5000 mg | INTRAMUSCULAR | Status: DC | PRN
Start: 2020-11-14 — End: 2020-11-17
  Administered 2020-11-15 – 2020-11-16 (×4): 0.5 mg via INTRAVENOUS
  Filled 2020-11-14 (×6): qty 0.5

## 2020-11-14 NOTE — Progress Notes (Addendum)
Patient ID: Brandon Booker, male   DOB: 1961-04-26, 60 y.o.   MRN: [id number]  1 Day Post-Op Subjective: Pt doing well.  Complaints of abdominal cramping.  No flatus.  No nausea or vomiting.   Objective: Vital signs in last 24 hours: Temp:  [97.7 F (36.5 C)-99.7 F (37.6 C)] 98.7 F (37.1 C) (02/11 0618) Pulse Rate:  [68-93] 71 (02/11 0618) Resp:  [9-20] 20 (02/11 0618) BP: (120-152)/(66-94) 125/80 (02/11 0618) SpO2:  [96 %-100 %] 96 % (02/11 0618) Weight:  [70.3 kg] 70.3 kg (02/10 0950)  Intake/Output from previous day: 02/10 0701 - 02/11 0700 In: 2500 [I.V.:2400; IV Piggyback:100] Out: 3175 [Urine:2975; Blood:200] Intake/Output this shift: No intake/output data recorded.  Physical Exam:  General: Alert and oriented CV: RRR Lungs: Clear Abdomen: Mild distention, positive bowel sounds Incisions: C/D/I Ext: NT, No erythema  Lab Results: Recent Labs    11/13/20 1526 11/14/20 0534  HGB 13.8 12.3*  HCT 42.6 37.0*   BMET Recent Labs    11/13/20 1526 11/14/20 0534  NA 137 136  K 4.3 4.0  CL 102 102  CO2 26 26  GLUCOSE 181* 111*  BUN 14 13  CREATININE 1.27* 1.33*  CALCIUM 8.7* 8.7*     Studies/Results: No results found.  Assessment/Plan: POD # 1 s/p left laparoscopic radical nephrectomy - Ambulate, IS - Advance diet - Oral pain medication - D/c Foley - Path pending - Follow renal function   LOS: 1 day   [name] 11/14/2020, 7:11 AM

## 2020-11-14 NOTE — Plan of Care (Signed)
  Problem: Education: Goal: Knowledge of the prescribed therapeutic regimen will improve Outcome: Progressing   Problem: Respiratory: Goal: Ability to achieve and maintain a regular respiratory rate will improve Outcome: Progressing   Problem: Skin Integrity: Goal: Demonstration of wound healing without infection will improve Outcome: Progressing   Problem: Urinary Elimination: Goal: Ability to avoid or minimize complications of infection will improve Outcome: Progressing Goal: Ability to achieve and maintain urine output will improve Outcome: Progressing

## 2020-11-14 NOTE — Plan of Care (Signed)
  Problem: Education: Goal: Knowledge of the prescribed therapeutic regimen will improve Outcome: Progressing   Problem: Respiratory: Goal: Ability to achieve and maintain a regular respiratory rate will improve Outcome: Progressing   Problem: Skin Integrity: Goal: Demonstration of wound healing without infection will improve Outcome: Progressing   Problem: Urinary Elimination: Goal: Ability to avoid or minimize complications of infection will improve Outcome: Progressing Goal: Ability to achieve and maintain urine output will improve Outcome: Progressing   Problem: Education: Goal: Knowledge of General Education information will improve Description: Including pain rating scale, medication(s)/side effects and non-pharmacologic comfort measures Outcome: Progressing   Problem: Activity: Goal: Risk for activity intolerance will decrease Outcome: Progressing   Problem: Coping: Goal: Level of anxiety will decrease Outcome: Progressing   Problem: Elimination: Goal: Will not experience complications related to urinary retention Outcome: Progressing   Problem: Safety: Goal: Ability to remain free from injury will improve Outcome: Progressing   Problem: Skin Integrity: Goal: Risk for impaired skin integrity will decrease Outcome: Progressing

## 2020-11-15 ENCOUNTER — Inpatient Hospital Stay (HOSPITAL_COMMUNITY): Payer: 59

## 2020-11-15 ENCOUNTER — Encounter (HOSPITAL_COMMUNITY): Payer: Self-pay | Admitting: Urology

## 2020-11-15 LAB — BASIC METABOLIC PANEL
Anion gap: 7 (ref 5–15)
BUN: 12 mg/dL (ref 6–20)
CO2: 29 mmol/L (ref 22–32)
Calcium: 8.9 mg/dL (ref 8.9–10.3)
Chloride: 98 mmol/L (ref 98–111)
Creatinine, Ser: 1.23 mg/dL (ref 0.61–1.24)
GFR, Estimated: >60 mL/min (ref >60)
Glucose, Bld: 119 mg/dL — ABNORMAL HIGH (ref 70–99)
Potassium: 4.1 mmol/L (ref 3.5–5.1)
Sodium: 134 mmol/L — ABNORMAL LOW (ref 135–145)

## 2020-11-15 MED ORDER — PROMETHAZINE HCL 25 MG/ML IJ SOLN
25.0000 mg | INTRAMUSCULAR | Status: DC | PRN
  Administered 2020-11-15 – 2020-11-16 (×4): 25 mg via INTRAVENOUS
  Filled 2020-11-15 (×4): qty 1

## 2020-11-15 MED ORDER — IOHEXOL 300 MG/ML  SOLN
100.0000 mL | Freq: Once | INTRAMUSCULAR | Status: AC | PRN
  Administered 2020-11-15: 75 mL via INTRAVENOUS

## 2020-11-15 NOTE — Progress Notes (Signed)
Per MD order, NGT to be placed d/t to mild ileus. Order carried out and confirmed placement by xray. Winter, MD confirmed placement via xray and advised it was okay to turn suction on. NGT now to [facility] and pt tolerating it well. Will continue to monitor.

## 2020-11-15 NOTE — Progress Notes (Signed)
RN spoke with [facility] Radiology and Dr. Lovena Neighbours regarding pts xray and placement of NGT. Per verbal order from Dr. Lovena Neighbours, pull back on NGT 1-2 inches and twist the tube, since it appears the tube is on the lateral wall of the stomach. RN carried out this order and now color of contents is a green/bile color. Pt given PRN phenergan and states he actually feels a lot better than he has all day. RN will continue to monitor the pt.

## 2020-11-15 NOTE — Plan of Care (Signed)
  Problem: Bowel/Gastric: Goal: Gastrointestinal status for postoperative course will improve Outcome: Progressing   Problem: Education: Goal: Knowledge of General Education information will improve Description: Including pain rating scale, medication(s)/side effects and non-pharmacologic comfort measures Outcome: Progressing   Problem: Clinical Measurements: Goal: Will remain free from infection Outcome: Progressing   Problem: Pain Managment: Goal: General experience of comfort will improve Outcome: Progressing   Problem: Activity: Goal: Risk for activity intolerance will decrease Outcome: Adequate for Discharge

## 2020-11-15 NOTE — Progress Notes (Signed)
2 Days Post-Op Subjective: Patient is complaining of abdominal distention and nausea this morning.  No emesis over night.  He states that he had several small bowel movements last night, but admits that they were not substantial.  He is not passing flatus.    Objective: Vital signs in last 24 hours: Temp:  [97.9 F (36.6 C)-99.4 F (37.4 C)] 97.9 F (36.6 C) (02/12 0556) Pulse Rate:  [81-82] 81 (02/12 0556) Resp:  [16-18] 18 (02/12 0556) BP: (128-145)/(81-94) 140/94 (02/12 0556) SpO2:  [95 %-99 %] 99 % (02/12 0556)  Intake/Output from previous day: 02/11 0701 - 02/12 0700 In: 3392.2 [P.O.:1440; I.V.:1952.2] Out: 1100 [Urine:1100]  Intake/Output this shift: No intake/output data recorded.  Physical Exam:  General: Alert and oriented.  Appears very uncomfortable. CV: RRR, palpable distal pulses Lungs: CTAB, equal chest rise Abdomen: Distended and tympanic.  Patient guarding.  No obvious rebound tenderness Incisions:  Clean, dry and intact Ext: NT, No erythema  Lab Results: Recent Labs    11/13/20 1526 11/14/20 0534  HGB 13.8 12.3*  HCT 42.6 37.0*   BMET Recent Labs    11/14/20 0534 11/15/20 0611  NA 136 134*  K 4.0 4.1  CL 102 98  CO2 26 29  GLUCOSE 111* 119*  BUN 13 12  CREATININE 1.33* 1.23  CALCIUM 8.7* 8.9     Studies/Results: No results found.  Assessment/Plan: Postop day 3 status post left laparoscopic radical nephrectomy  -CT abdomen pelvis pending to rule out potential intestinal injury following surgery.  Clinical presentation appears to be an ileus.  NG tube ordered -Out of bed to chair and ambulate -Will continue to closely monitor   LOS: 2 days   Ellison Hughs, MD [facility] Pager: [phone]  11/15/2020, 10:07 AM

## 2020-11-16 ENCOUNTER — Inpatient Hospital Stay (HOSPITAL_COMMUNITY): Payer: 59

## 2020-11-16 LAB — CBC
HCT: 44.7 % (ref 39.0–52.0)
Hemoglobin: 15 g/dL (ref 13.0–17.0)
MCH: 29.1 pg (ref 26.0–34.0)
MCHC: 33.6 g/dL (ref 30.0–36.0)
MCV: 86.8 fL (ref 80.0–100.0)
Platelets: 221 10*3/uL (ref 150–400)
RBC: 5.15 MIL/uL (ref 4.22–5.81)
RDW: 12.5 % (ref 11.5–15.5)
WBC: 8.6 10*3/uL (ref 4.0–10.5)
nRBC: 0 % (ref 0.0–0.2)

## 2020-11-16 LAB — BASIC METABOLIC PANEL
Anion gap: 11 (ref 5–15)
BUN: 13 mg/dL (ref 6–20)
CO2: 26 mmol/L (ref 22–32)
Calcium: 8.9 mg/dL (ref 8.9–10.3)
Chloride: 94 mmol/L — ABNORMAL LOW (ref 98–111)
Creatinine, Ser: 1.33 mg/dL — ABNORMAL HIGH (ref 0.61–1.24)
GFR, Estimated: >60 mL/min (ref >60)
Glucose, Bld: 142 mg/dL — ABNORMAL HIGH (ref 70–99)
Potassium: 3.7 mmol/L (ref 3.5–5.1)
Sodium: 131 mmol/L — ABNORMAL LOW (ref 135–145)

## 2020-11-16 MED ORDER — BISACODYL 10 MG RE SUPP
10.0000 mg | Freq: Every day | RECTAL | Status: DC
  Administered 2020-11-16 – 2020-11-18 (×2): 10 mg via RECTAL
  Filled 2020-11-16 (×2): qty 1

## 2020-11-16 NOTE — Progress Notes (Signed)
3 Days Post-Op Subjective: Feeling better this morning, but still bloated.  He continues to have periodic episodes of nausea, but it is less severe compared to yesterday.  He is still not passing flatus or had a bowel movement.  NG output improved after retracting the tube a couple of inches last night.  Objective: Vital signs in last 24 hours: Temp:  [98.4 F (36.9 C)-99.8 F (37.7 C)] 99.3 F (37.4 C) (02/13 0628) Pulse Rate:  [91-110] 110 (02/13 0628) Resp:  [18-20] 20 (02/13 0628) BP: (140-177)/(92-113) 140/102 (02/13 0628) SpO2:  [93 %-99 %] 96 % (02/13 0628)  Intake/Output from previous day: 02/12 0701 - 02/13 0700 In: 2141.6 [I.V.:1901.6; NG/GT:240] Out: 1425 [Urine:425; Emesis/NG output:1000]  Intake/Output this shift: No intake/output data recorded.  Physical Exam:  General: Alert and oriented CV:  palpable distal pulses Lungs: equal chest rise Abdomen: Distended, but nontender.  No rebound or guarding.  NG tube in place with bilious output Incisions: Clean, dry and intact Ext: NT, No erythema  Lab Results: Recent Labs    11/13/20 1526 11/14/20 0534 11/16/20 0517  HGB 13.8 12.3* 15.0  HCT 42.6 37.0* 44.7   BMET Recent Labs    11/15/20 0611 11/16/20 0517  NA 134* 131*  K 4.1 3.7  CL 98 94*  CO2 29 26  GLUCOSE 119* 142*  BUN 12 13  CREATININE 1.23 1.33*  CALCIUM 8.9 8.9     Studies/Results: DG Abd 1 View  Result Date: 11/16/2020 CLINICAL DATA:  Nausea. EXAM: ABDOMEN - 1 VIEW COMPARISON:  Abdominal x-ray and CT abdomen pelvis from yesterday. FINDINGS: Unchanged enteric tube in the stomach. Similar appearing borderline dilated air-filled small bowel and colon. Unchanged postoperative pneumoperitoneum under the hemidiaphragms. Excreted contrast within the bladder. No acute osseous abnormality. IMPRESSION: 1. Similar appearing postoperative ileus. Electronically Signed   By: Titus Dubin M.D.   On: 11/16/2020 10:39   DG Abd 1 View  Result Date:  11/15/2020 CLINICAL DATA:  Tube placement EXAM: ABDOMEN - 1 VIEW COMPARISON:  Same day CT abdomen pelvis FINDINGS: Interval placement of esophagogastric tube, tip and side port below the diaphragm. Redemonstrated gas-filled, nondistended small bowel and colon. Free air below the right hemidiaphragm in keeping with postoperative pneumoperitoneum seen on same day prior CT. IMPRESSION: 1. Interval placement of esophagogastric tube, tip and side port below the diaphragm. 2. Redemonstrated gas-filled, nondistended small bowel and colon and postoperative pneumoperitoneum. Electronically Signed   By: Eddie Candle M.D.   On: 11/15/2020 14:15   CT ABDOMEN PELVIS W CONTRAST  Result Date: 11/15/2020 CLINICAL DATA:  Status post left nephrectomy for renal mass, nausea, vomiting, abdominal pain, query bowel obstruction EXAM: CT ABDOMEN AND PELVIS WITH CONTRAST TECHNIQUE: Multidetector CT imaging of the abdomen and pelvis was performed using the standard protocol following bolus administration of intravenous contrast. CONTRAST:  68mL OMNIPAQUE IOHEXOL 300 MG/ML  SOLN COMPARISON:  CT abdomen pelvis, 09/19/2020 FINDINGS: Lower chest: No acute abnormality. Hepatobiliary: No solid liver abnormality is seen. No gallstones, gallbladder wall thickening, or biliary dilatation. Pancreas: Unremarkable. No pancreatic ductal dilatation or surrounding inflammatory changes. Spleen: Normal in size without significant abnormality. Adrenals/Urinary Tract: Adrenal glands are unremarkable. Status post interval left nephrectomy. There is an air and fluid collection in the nephrectomy bed measuring approximately 5.9 x 4.1 x 1.4 cm (series 2, image 34, series 5, image 95). The right kidney is normal in appearance. Bladder is unremarkable. Stomach/Bowel: Stomach is within normal limits. Appendix not clearly visualized. The small bowel and  colon are diffusely gas and fluid-filled although nondistended, with fluid and gas present to the distal colon.  No evidence of bowel wall thickening, distention, or inflammatory changes. Vascular/Lymphatic: No significant vascular findings are present. No enlarged abdominal or pelvic lymph nodes. Reproductive: No mass or other significant abnormality. Other: Subcutaneous emphysema about the ventral abdominal wall. Small volume pneumoperitoneum and ascites throughout the abdomen and pelvis. Musculoskeletal: No acute or significant osseous findings. IMPRESSION: 1. Status post interval left nephrectomy. There is an air and fluid collection in the nephrectomy bed measuring approximately 5.9 x 4.1 x 1.4 cm, likely reflecting introduced air and Surgicel in the immediate postoperative setting. The presence or absence of infection in any collection is however not established by CT. 2. The small bowel and colon are diffusely gas and fluid-filled although nondistended, with fluid and gas present to the distal colon. This may reflect mild postoperative ileus. No evidence of bowel obstruction. 3. Small volume pneumoperitoneum and ascites throughout the abdomen and pelvis as well as subcutaneous emphysema about the abdominal wall, postoperative in nature. Electronically Signed   By: Eddie Candle M.D.   On: 11/15/2020 12:49    Assessment/Plan: Postop day 3 status post left laparoscopic radical nephrectomy.  Now with postoperative ileus  -Keep NG tube to low intermittent suction -I encouraged him to sit up in bed or in a chair for the majority of the day -Start Dulcolax suppositories -Continue IV fluids -Will continue to monitor   LOS: 3 days   Brandon Hughs, MD [facility] Pager: [phone]  11/16/2020, 11:04 AM

## 2020-11-17 ENCOUNTER — Inpatient Hospital Stay (HOSPITAL_COMMUNITY): Payer: 59

## 2020-11-17 LAB — BASIC METABOLIC PANEL
Anion gap: 11 (ref 5–15)
BUN: 16 mg/dL (ref 6–20)
CO2: 32 mmol/L (ref 22–32)
Calcium: 8.7 mg/dL — ABNORMAL LOW (ref 8.9–10.3)
Chloride: 90 mmol/L — ABNORMAL LOW (ref 98–111)
Creatinine, Ser: 1.49 mg/dL — ABNORMAL HIGH (ref 0.61–1.24)
GFR, Estimated: 54 mL/min — ABNORMAL LOW (ref >60)
Glucose, Bld: 128 mg/dL — ABNORMAL HIGH (ref 70–99)
Potassium: 3.4 mmol/L — ABNORMAL LOW (ref 3.5–5.1)
Sodium: 133 mmol/L — ABNORMAL LOW (ref 135–145)

## 2020-11-17 LAB — CBC
HCT: 39.5 % (ref 39.0–52.0)
Hemoglobin: 13.3 g/dL (ref 13.0–17.0)
MCH: 29.3 pg (ref 26.0–34.0)
MCHC: 33.7 g/dL (ref 30.0–36.0)
MCV: 87 fL (ref 80.0–100.0)
Platelets: 225 10*3/uL (ref 150–400)
RBC: 4.54 MIL/uL (ref 4.22–5.81)
RDW: 12.7 % (ref 11.5–15.5)
WBC: 7.8 10*3/uL (ref 4.0–10.5)
nRBC: 0 % (ref 0.0–0.2)

## 2020-11-17 NOTE — Progress Notes (Signed)
Patient has ambulated hourly in hallway during the shift, as well as in room

## 2020-11-17 NOTE — Progress Notes (Signed)
Patient ID: Brandon Booker, male   DOB: 11/07/1960, 60 y.o.   MRN: [id number]  Pt has done well with NG clamped.  Denies nausea.  BM around noon today.    I offered to remove the NG tube but he is reluctant.  Will start sips and chips tonight and keep NG tube clamped with plans to removed it in the morning and advance his diet if doing well.

## 2020-11-17 NOTE — Plan of Care (Signed)
  Problem: Bowel/Gastric: Goal: Gastrointestinal status for postoperative course will improve Outcome: Progressing   Problem: Education: Goal: Knowledge of General Education information will improve Description: Including pain rating scale, medication(s)/side effects and non-pharmacologic comfort measures Outcome: Progressing   Problem: Health Behavior/Discharge Planning: Goal: Ability to manage health-related needs will improve Outcome: Progressing   Problem: Coping: Goal: Level of anxiety will decrease Outcome: Progressing   Problem: Pain Managment: Goal: General experience of comfort will improve Outcome: Progressing

## 2020-11-17 NOTE — Plan of Care (Signed)
°  Problem: Elimination: °Goal: Will not experience complications related to bowel motility °Outcome: Progressing °  °Problem: Pain Managment: °Goal: General experience of comfort will improve °Outcome: Progressing °  °

## 2020-11-17 NOTE — Progress Notes (Signed)
Patient ID: Brandon Booker, male   DOB: 08-May-1961, 60 y.o.   MRN: [id number]  4 Days Post-Op Subjective: NG placed over weekend due to prolonged ileus with findings on CT to confirm.  Now passing flatus and feeling much improved.  Has not needed much pain medication in the last 24 hrs.  Ambulating well and voiding well.  Objective: Vital signs in last 24 hours: Temp:  [98.1 F (36.7 C)-98.6 F (37 C)] 98.1 F (36.7 C) (02/14 0457) Pulse Rate:  [103-110] 103 (02/14 0457) Resp:  [16-18] 18 (02/14 0457) BP: (128-134)/(87-100) 128/87 (02/14 0457) SpO2:  [95 %-99 %] 95 % (02/14 0457)  Intake/Output from previous day: 02/13 0701 - 02/14 0700 In: 1630.4 [I.V.:1630.4] Out: 2025 [Urine:475; Emesis/NG output:1550] Intake/Output this shift: No intake/output data recorded.  Physical Exam:  General: Alert and oriented CV: RRR Lungs: Clear Abdomen: Soft, ND, positive BS.  Still tender on left abdomen but improved.  No rebound tenderness.  NG in place. Incisions: C/D/I Ext: NT, No erythema  Lab Results: Recent Labs    11/16/20 0517  HGB 15.0  HCT 44.7   BMET Recent Labs    11/15/20 0611 11/16/20 0517  NA 134* 131*  K 4.1 3.7  CL 98 94*  CO2 29 26  GLUCOSE 119* 142*  BUN 12 13  CREATININE 1.23 1.33*  CALCIUM 8.9 8.9     Studies/Results: DG Abd 1 View  Result Date: 11/17/2020 CLINICAL DATA:  Ileus.  Unable to lay flat.  Upright. EXAM: ABDOMEN - 1 VIEW COMPARISON:  CT abdomen pelvis 11/15/2020, x-ray abdomen 11/16/2020 FINDINGS: Enteric tube coursing below the hemidiaphragm with tip and side port overlying the expected region of the gastric lumen. Persistent pneumomediastinum. Similar-appearing gaseous dilatation of the small and large bowel. No radio-opaque calculi or other significant radiographic abnormality are seen. IMPRESSION: 1. Persistent pneumomediastinum. 2. Gaseous dilatation of small and large bowel likely representing a postop ileus. Electronically Signed   By:  Iven Finn M.D.   On: 11/17/2020 06:17   DG Abd 1 View  Result Date: 11/16/2020 CLINICAL DATA:  Nausea. EXAM: ABDOMEN - 1 VIEW COMPARISON:  Abdominal x-ray and CT abdomen pelvis from yesterday. FINDINGS: Unchanged enteric tube in the stomach. Similar appearing borderline dilated air-filled small bowel and colon. Unchanged postoperative pneumoperitoneum under the hemidiaphragms. Excreted contrast within the bladder. No acute osseous abnormality. IMPRESSION: 1. Similar appearing postoperative ileus. Electronically Signed   By: Titus Dubin M.D.   On: 11/16/2020 10:39   DG Abd 1 View  Result Date: 11/15/2020 CLINICAL DATA:  Tube placement EXAM: ABDOMEN - 1 VIEW COMPARISON:  Same day CT abdomen pelvis FINDINGS: Interval placement of esophagogastric tube, tip and side port below the diaphragm. Redemonstrated gas-filled, nondistended small bowel and colon. Free air below the right hemidiaphragm in keeping with postoperative pneumoperitoneum seen on same day prior CT. IMPRESSION: 1. Interval placement of esophagogastric tube, tip and side port below the diaphragm. 2. Redemonstrated gas-filled, nondistended small bowel and colon and postoperative pneumoperitoneum. Electronically Signed   By: Eddie Candle M.D.   On: 11/15/2020 14:15   CT ABDOMEN PELVIS W CONTRAST  Result Date: 11/15/2020 CLINICAL DATA:  Status post left nephrectomy for renal mass, nausea, vomiting, abdominal pain, query bowel obstruction EXAM: CT ABDOMEN AND PELVIS WITH CONTRAST TECHNIQUE: Multidetector CT imaging of the abdomen and pelvis was performed using the standard protocol following bolus administration of intravenous contrast. CONTRAST:  87mL OMNIPAQUE IOHEXOL 300 MG/ML  SOLN COMPARISON:  CT abdomen pelvis, 09/19/2020  FINDINGS: Lower chest: No acute abnormality. Hepatobiliary: No solid liver abnormality is seen. No gallstones, gallbladder wall thickening, or biliary dilatation. Pancreas: Unremarkable. No pancreatic ductal  dilatation or surrounding inflammatory changes. Spleen: Normal in size without significant abnormality. Adrenals/Urinary Tract: Adrenal glands are unremarkable. Status post interval left nephrectomy. There is an air and fluid collection in the nephrectomy bed measuring approximately 5.9 x 4.1 x 1.4 cm (series 2, image 34, series 5, image 95). The right kidney is normal in appearance. Bladder is unremarkable. Stomach/Bowel: Stomach is within normal limits. Appendix not clearly visualized. The small bowel and colon are diffusely gas and fluid-filled although nondistended, with fluid and gas present to the distal colon. No evidence of bowel wall thickening, distention, or inflammatory changes. Vascular/Lymphatic: No significant vascular findings are present. No enlarged abdominal or pelvic lymph nodes. Reproductive: No mass or other significant abnormality. Other: Subcutaneous emphysema about the ventral abdominal wall. Small volume pneumoperitoneum and ascites throughout the abdomen and pelvis. Musculoskeletal: No acute or significant osseous findings. IMPRESSION: 1. Status post interval left nephrectomy. There is an air and fluid collection in the nephrectomy bed measuring approximately 5.9 x 4.1 x 1.4 cm, likely reflecting introduced air and Surgicel in the immediate postoperative setting. The presence or absence of infection in any collection is however not established by CT. 2. The small bowel and colon are diffusely gas and fluid-filled although nondistended, with fluid and gas present to the distal colon. This may reflect mild postoperative ileus. No evidence of bowel obstruction. 3. Small volume pneumoperitoneum and ascites throughout the abdomen and pelvis as well as subcutaneous emphysema about the abdominal wall, postoperative in nature. Electronically Signed   By: Eddie Candle M.D.   On: 11/15/2020 12:49    Assessment/Plan: POD # 4 s/p left laparoscopic radical nephrectomy - Clamp NG this morning with  plans to remove later today if still doing well.   - Ambulate, IS, DVT prophylaxis - Follow renal function - Path pending   LOS: 4 days   [name] 11/17/2020, 7:26 AM

## 2020-11-18 NOTE — Plan of Care (Signed)
  Problem: Education: Goal: Knowledge of the prescribed therapeutic regimen will improve Outcome: Adequate for Discharge   Problem: Bowel/Gastric: Goal: Gastrointestinal status for postoperative course will improve 11/18/2020 1824 by Saunders Glance, RN Outcome: Adequate for Discharge 11/18/2020 1735 by Saunders Glance, RN Outcome: Progressing   Problem: Clinical Measurements: Goal: Postoperative complications will be avoided or minimized Outcome: Adequate for Discharge   Problem: Respiratory: Goal: Ability to achieve and maintain a regular respiratory rate will improve Outcome: Adequate for Discharge   Problem: Skin Integrity: Goal: Demonstration of wound healing without infection will improve Outcome: Adequate for Discharge   Problem: Urinary Elimination: Goal: Ability to avoid or minimize complications of infection will improve Outcome: Adequate for Discharge Goal: Ability to achieve and maintain urine output will improve Outcome: Adequate for Discharge   Problem: Education: Goal: Knowledge of General Education information will improve Description: Including pain rating scale, medication(s)/side effects and non-pharmacologic comfort measures Outcome: Adequate for Discharge   Problem: Health Behavior/Discharge Planning: Goal: Ability to manage health-related needs will improve Outcome: Adequate for Discharge   Problem: Clinical Measurements: Goal: Ability to maintain clinical measurements within normal limits will improve Outcome: Adequate for Discharge Goal: Will remain free from infection Outcome: Adequate for Discharge Goal: Diagnostic test results will improve Outcome: Adequate for Discharge Goal: Respiratory complications will improve Outcome: Adequate for Discharge Goal: Cardiovascular complication will be avoided Outcome: Adequate for Discharge   Problem: Activity: Goal: Risk for activity intolerance will decrease Outcome: Adequate for Discharge    Problem: Nutrition: Goal: Adequate nutrition will be maintained 11/18/2020 1824 by Saunders Glance, RN Outcome: Adequate for Discharge 11/18/2020 1735 by Saunders Glance, RN Outcome: Progressing   Problem: Coping: Goal: Level of anxiety will decrease Outcome: Adequate for Discharge   Problem: Elimination: Goal: Will not experience complications related to bowel motility Outcome: Adequate for Discharge Goal: Will not experience complications related to urinary retention Outcome: Adequate for Discharge   Problem: Pain Managment: Goal: General experience of comfort will improve Outcome: Adequate for Discharge   Problem: Safety: Goal: Ability to remain free from injury will improve Outcome: Adequate for Discharge   Problem: Skin Integrity: Goal: Risk for impaired skin integrity will decrease Outcome: Adequate for Discharge

## 2020-11-18 NOTE — Discharge Summary (Signed)
  Date of admission: 11/13/2020  Date of discharge: 11/18/2020  Admission diagnosis: Left renal neoplasm  Discharge diagnosis: Left renal neoplasm  Secondary diagnoses: None  History and Physical: For full details, please see admission history and physical. Briefly, Brandon Booker is a 60 y.o. year old patient who presented in October after a trauma.  He was noted to have a large left perinephric hematoma.  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  No evidence of metastatic disease was noted.  Hospital Course: He was taken to the operating room on 11/13/2020 and underwent a left laparoscopic radical nephrectomy.  This was a challenging procedure due to his prior large perinephric hematoma.  However, this was able to be completed laparoscopically without complications.  Over the next 48 hours, he did develop a significant postoperative ileus requiring nasogastric tube insertion.  CT imaging was performed with findings consistent with a postoperative ileus without other significant concerns.  He subsequently improved with conservative therapy.  His nasogastric tube was able to be removed on the morning of 11/18/2020 after he was able to tolerate liquids the previous evening and having undergone a trial of clamping for 24 hours successfully.  His diet was advanced on postoperative day 5 and he was able to be discharged home that evening in good condition with return of bowel function.  Laboratory values: Recent Labs    11/16/20 0517 11/17/20 0728  HGB 15.0 13.3  HCT 44.7 39.5   Recent Labs    11/16/20 0517 11/17/20 0728  CREATININE 1.33* 1.49*    Disposition: Home  Discharge instruction: The patient was instructed to be ambulatory but told to refrain from heavy lifting, strenuous activity, or driving.   Discharge medications:  Allergies as of 11/18/2020   No Known Allergies     Medication  List    STOP taking these medications   Acetyl L-Carnitine 500 MG Caps   BENFOTIAMINE PO   CALCIUM PO   LIPOIC ACID PO   multivitamin with minerals Tabs tablet   VITAMIN C PO     TAKE these medications   acetaminophen 325 MG tablet Commonly known as: TYLENOL Take 2 tablets (650 mg total) by mouth every 6 (six) hours as needed for mild pain (or Fever >/= 101).   docusate sodium 100 MG capsule Commonly known as: COLACE Take 1 capsule (100 mg total) by mouth 2 (two) times daily.   folic acid 1 MG tablet Commonly known as: FOLVITE Take 1 tablet (1 mg total) by mouth daily.   Iron 325 (65 Fe) MG Tabs Take 1 tablet (325 mg total) by mouth daily.   ondansetron 4 MG tablet Commonly known as: Zofran Take 1 tablet (4 mg total) by mouth daily as needed for nausea or vomiting.   polyethylene glycol 17 g packet Commonly known as: MIRALAX / GLYCOLAX Take 17 g by mouth daily as needed for moderate constipation.   traMADol 50 MG tablet Commonly known as: Ultram Take 1-2 tablets (50-100 mg total) by mouth every 6 (six) hours as needed for moderate pain or severe pain.       Followup:   Follow-up Information    Raynelle Bring, MD On 12/09/2020.   Specialty: Urology Why: at 9:15 Contact information: [place] [id number] [phone]

## 2020-11-18 NOTE — Progress Notes (Signed)
Patient given discharge, follow up, and medication instructions, verbalized understanding, IV removed, personal belongings with patient, will call family to transport home

## 2020-11-18 NOTE — Plan of Care (Signed)
  Problem: Bowel/Gastric: Goal: Gastrointestinal status for postoperative course will improve Outcome: Progressing   Problem: Nutrition: Goal: Adequate nutrition will be maintained Outcome: Progressing

## 2020-11-18 NOTE — Plan of Care (Signed)
  Problem: Education: Goal: Knowledge of the prescribed therapeutic regimen will improve Outcome: Progressing   Problem: Bowel/Gastric: Goal: Gastrointestinal status for postoperative course will improve Outcome: Progressing   Problem: Respiratory: Goal: Ability to achieve and maintain a regular respiratory rate will improve Outcome: Progressing   Problem: Skin Integrity: Goal: Demonstration of wound healing without infection will improve Outcome: Progressing   Problem: Urinary Elimination: Goal: Ability to avoid or minimize complications of infection will improve Outcome: Progressing Goal: Ability to achieve and maintain urine output will improve Outcome: Progressing   Problem: Education: Goal: Knowledge of General Education information will improve Description: Including pain rating scale, medication(s)/side effects and non-pharmacologic comfort measures Outcome: Progressing   Problem: Activity: Goal: Risk for activity intolerance will decrease Outcome: Progressing   Problem: Coping: Goal: Level of anxiety will decrease Outcome: Progressing   Problem: Elimination: Goal: Will not experience complications related to urinary retention Outcome: Progressing   Problem: Pain Managment: Goal: General experience of comfort will improve Outcome: Progressing   Problem: Safety: Goal: Ability to remain free from injury will improve Outcome: Progressing   Problem: Skin Integrity: Goal: Risk for impaired skin integrity will decrease Outcome: Progressing

## 2020-11-18 NOTE — Progress Notes (Signed)
Patient ID: Brandon Booker, male   DOB: Sep 27, 1961, 60 y.o.   MRN: [id number]  5 Days Post-Op Subjective: Pt has done well with NG clamped for 24 hrs.  He has been tolerating ice chips with some feelings of fullness/bloating but no nausea/vomiting.  Has continued to pass flatus and have BMs.  Objective: Vital signs in last 24 hours: Temp:  [98.1 F (36.7 C)-99.5 F (37.5 C)] 99.5 F (37.5 C) (02/15 0535) Pulse Rate:  [91-100] 91 (02/15 0535) Resp:  [16] 16 (02/15 0535) BP: (138-148)/(88-89) 138/89 (02/15 0535) SpO2:  [95 %-99 %] 99 % (02/15 0535)  Intake/Output from previous day: 02/14 0701 - 02/15 0700 In: 2209.5 [I.V.:2209.5] Out: 1225 [Urine:1225] Intake/Output this shift: No intake/output data recorded.  Physical Exam:  General: Alert and oriented Abdomen: Soft, very mild distention, some BS Incisions: C/D/I Ext: NT, No erythema  Lab Results: Recent Labs    11/16/20 0517 11/17/20 0728  HGB 15.0 13.3  HCT 44.7 39.5   BMET Recent Labs    11/16/20 0517 11/17/20 0728  NA 131* 133*  K 3.7 3.4*  CL 94* 90*  CO2 26 32  GLUCOSE 142* 128*  BUN 13 16  CREATININE 1.33* 1.49*  CALCIUM 8.9 8.7*     Studies/Results: Path pending  Assessment/Plan: POD # 5 s/p left laparoscopic radical nephrectomy - Ileus appears resolving.  NG tube removed.  Will advance diet. - Decrease IVF - Will re-evaluate later today.  Discharge will be pending recovery of bowel function and ability to tolerate diet.   LOS: 5 days   Brandon Booker 11/18/2020, 7:33 AM

## 2020-11-19 LAB — SURGICAL PATHOLOGY

## 2021-04-22 IMAGING — DX DG ABDOMEN 1V
2 series · 2 of 2 positions shown · non-contrast
Comparison: Abdominal x-ray and CT abdomen pelvis from yesterday.

CLINICAL DATA: Nausea.

EXAM:
ABDOMEN - 1 VIEW

[abdomen kub (1 of 2)]
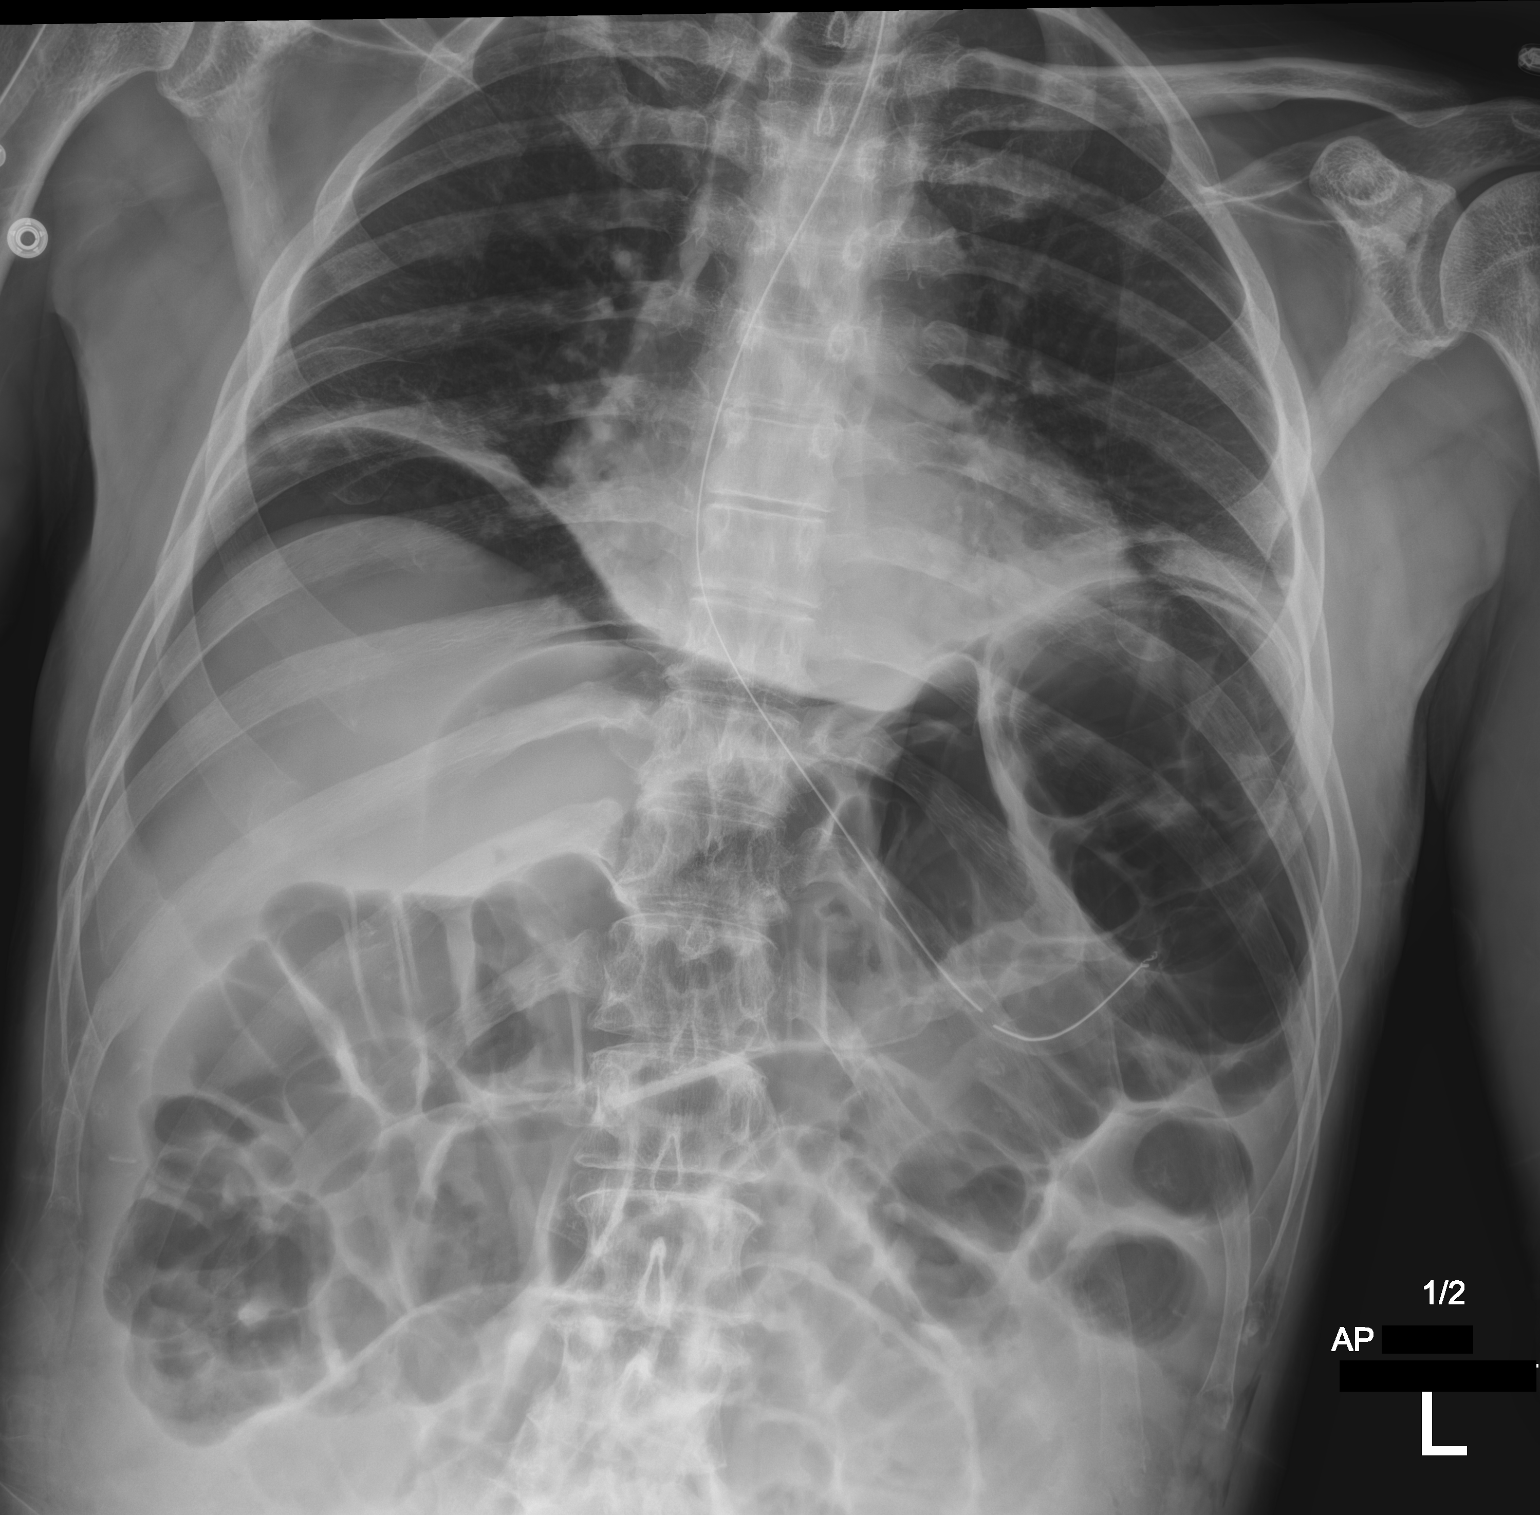

[abdomen kub (2 of 2)]
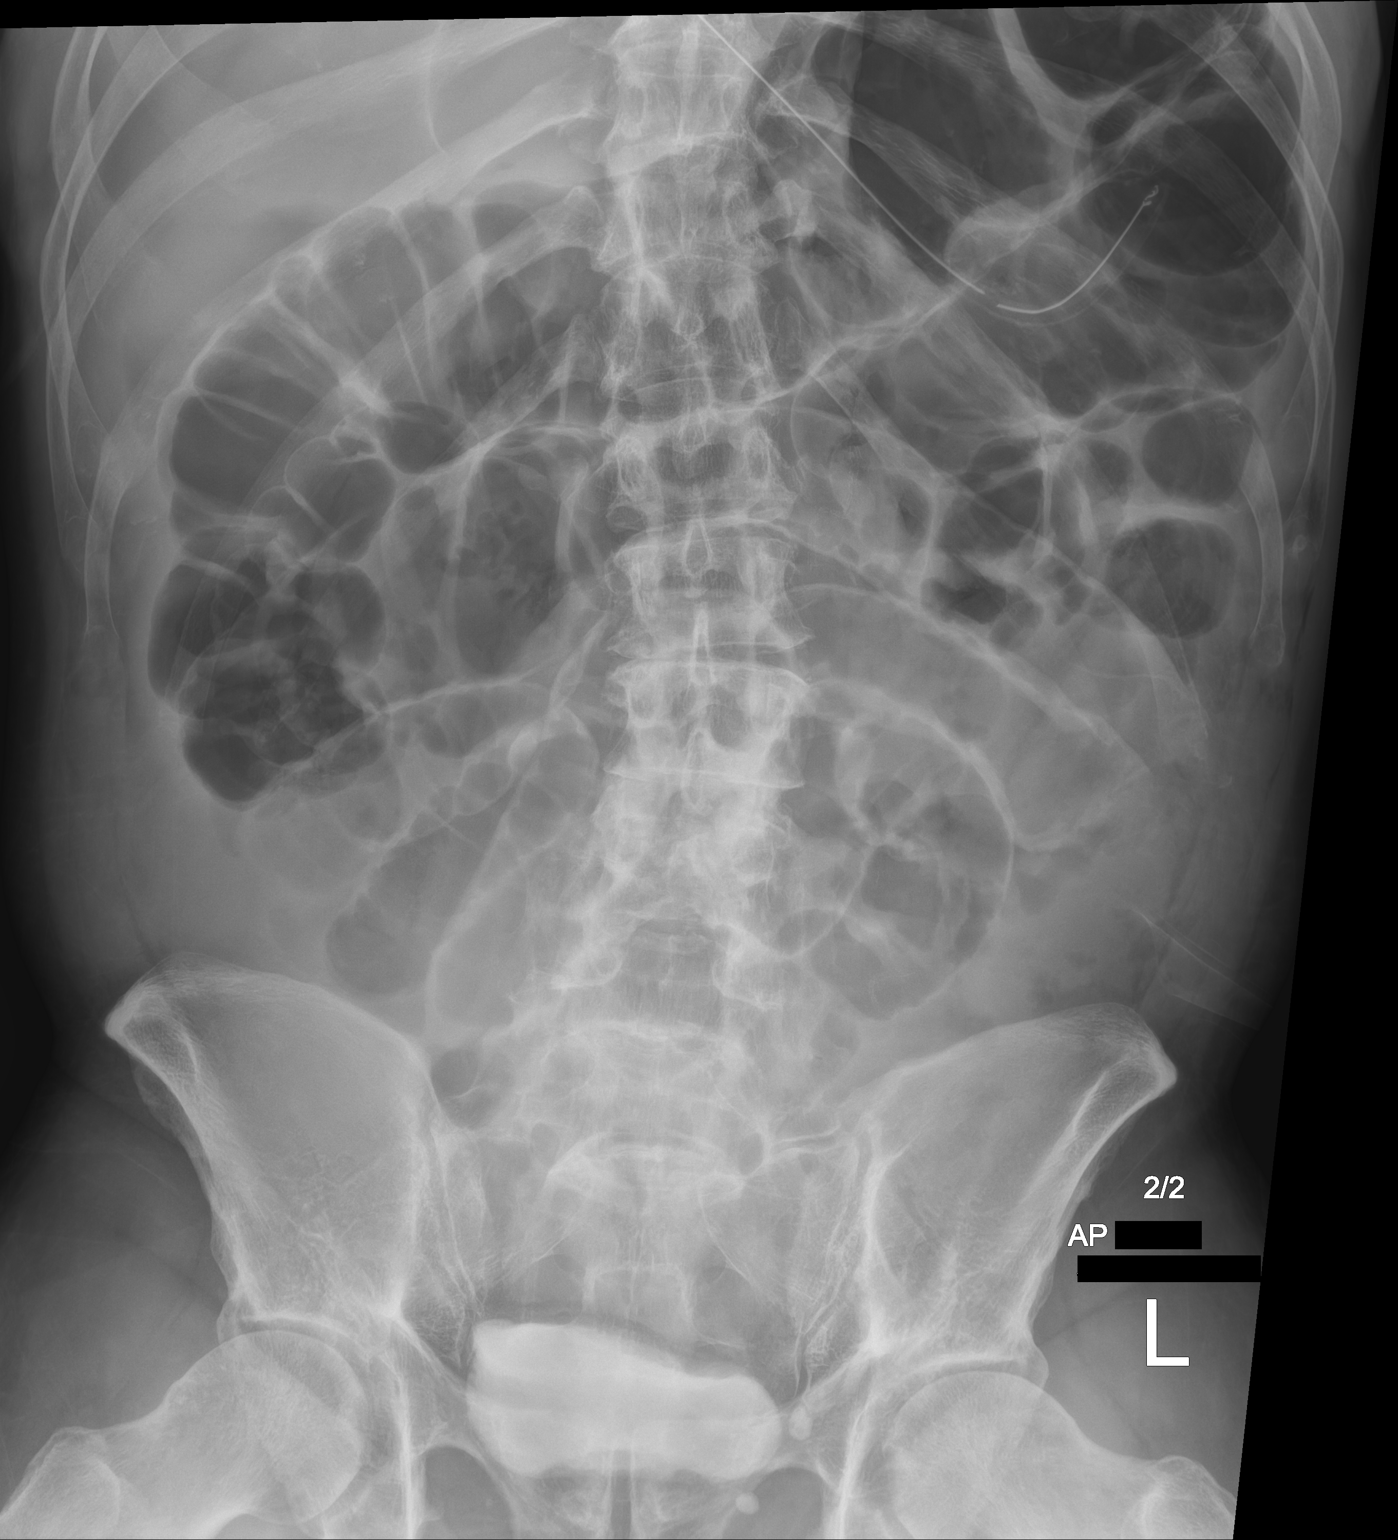

[2 of 2 positions shown; findings below may reference images not displayed]

FINDINGS: Unchanged enteric tube in the stomach. Similar appearing borderline
dilated air-filled small bowel and colon. Unchanged postoperative
pneumoperitoneum under the hemidiaphragms. Excreted contrast within
the bladder. No acute osseous abnormality.
IMPRESSION: 1. Similar appearing postoperative ileus.

## 2021-05-28 ENCOUNTER — Other Ambulatory Visit: Payer: Self-pay | Admitting: Orthopedic Surgery

## 2021-07-28 ENCOUNTER — Ambulatory Visit: Payer: Self-pay | Admitting: Surgery

## 2021-07-28 ENCOUNTER — Encounter: Payer: Self-pay | Admitting: Surgery

## 2021-08-04 NOTE — Progress Notes (Signed)
Brandon Booker  08/04/2021   Your procedure is scheduled on:             08/20/2021   Report to [facility] Main  Entrance   Report to admitting at     1115 am      Call this number if you have problems the morning of surgery [phone]           REMEMBER: FOLLOW ALL YOUR BOWEL PREP INSTRUCTIONS WITH CLEAR LIQUIDS FROM [place]. DRINK 2 PRESURGERY ENSURE DRINKS THE NIGHT BEFORE SURGERY AT 1000 PM AND 1 PRESURGERY DRINK THE DAY OF THE PROCEDURE 3 HOURS PRIOR TO SCHEDULED SURGERY.  NOTHING BY MOUTH EXCEPT CLEAR LIQUIDS UNTIL THREE HOURS PRIOR TO SCHEDULED SURGERY. PLEASE FINISH PRESURGERY 3RD  ENSURE  DRINK PER SURGEON ORDER 3 HOURS PRIOR TO SCHEDULED SURGERY TIME WHICH NEEDS TO BE COMPLETED AT ____ 52 am _____.     CLEAR LIQUID DIET   Foods Allowed                                                                      Coffee and tea, regular and decaf                           Plain Jell-O any favor except red or purple                                         Fruit ices (not with fruit pulp)                                      Iced Popsicles                                     Carbonated beverages, regular and diet                                    Cranberry, grape and apple juices Sports drinks like Gatorade Lightly seasoned clear broth or consume(fat free) Sugar  _____________________________________________________________________      BRUSH YOUR TEETH MORNING OF SURGERY AND RINSE YOUR MOUTH OUT, NO CHEWING GUM CANDY OR MINTS.     Take these medicines the morning of surgery with A SIP OF WATER:   None  DO NOT TAKE ANY DIABETIC MEDICATIONS DAY OF YOUR SURGERY                               You may not have any metal on your body including hair pins and              piercings  Do not wear jewelry, make-up, lotions, powders or perfumes, deodorant  Do not wear nail polish on your fingernails.  Do not shave  48  hours prior to surgery.              Men may shave face and neck.   Do not bring valuables to the hospital. [place].  Contacts, dentures or bridgework may not be worn into surgery.  Leave suitcase in the car. After surgery it may be brought to your room.                 Please read over the following fact sheets you were given: _____________________________________________________________________  [facility] - Preparing for Surgery Before surgery, you can play an important role.  Because skin is not sterile, your skin needs to be as free of germs as possible.  You can reduce the number of germs on your skin by washing with CHG (chlorahexidine gluconate) soap before surgery.  CHG is an antiseptic cleaner which kills germs and bonds with the skin to continue killing germs even after washing. Please DO NOT use if you have an allergy to CHG or antibacterial soaps.  If your skin becomes reddened/irritated stop using the CHG and inform your nurse when you arrive at Short Stay. Do not shave (including legs and underarms) for at least 48 hours prior to the first CHG shower.  You may shave your face/neck. Please follow these instructions carefully:  1.  Shower with CHG Soap the night before surgery and the  morning of Surgery.  2.  If you choose to wash your hair, wash your hair first as usual with your  normal  shampoo.  3.  After you shampoo, rinse your hair and body thoroughly to remove the  shampoo.                           4.  Use CHG as you would any other liquid soap.  You can apply chg directly  to the skin and wash                       Gently with a scrungie or clean washcloth.  5.  Apply the CHG Soap to your body ONLY FROM THE NECK DOWN.   Do not use on face/ open                           Wound or open sores. Avoid contact with eyes, ears mouth and genitals (private parts).                       Wash face,  Genitals (private parts) with  your normal soap.             6.  Wash thoroughly, paying special attention to the area where your surgery  will be performed.  7.  Thoroughly rinse your body with warm water from the neck down.  8.  DO NOT shower/wash with your normal soap after using and rinsing off  the CHG Soap.                9.  Pat yourself dry with a clean towel.            10.  Wear clean pajamas.  11.  Place clean sheets on your bed the night of your first shower and do not  sleep with pets. Day of Surgery : Do not apply any lotions/deodorants the morning of surgery.  Please wear clean clothes to the hospital/surgery center.  FAILURE TO FOLLOW THESE INSTRUCTIONS MAY RESULT IN THE CANCELLATION OF YOUR SURGERY PATIENT SIGNATURE_________________________________  NURSE SIGNATURE__________________________________  ________________________________________________________________________

## 2021-08-04 NOTE — Progress Notes (Addendum)
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  PT states vocal cord paralysis is 95% improved.

## 2021-08-06 ENCOUNTER — Encounter (HOSPITAL_COMMUNITY)
Admission: RE | Admit: 2021-08-06 | Discharge: 2021-08-06 | Disposition: A | Discharge status: Home / Self Care | Payer: 59 | Source: Ambulatory Visit | Attending: Surgery | Admitting: Surgery

## 2021-08-06 ENCOUNTER — Encounter (HOSPITAL_COMMUNITY): Payer: Self-pay

## 2021-08-06 ENCOUNTER — Other Ambulatory Visit: Payer: Self-pay

## 2021-08-06 VITALS — BP 124/82 | HR 74 | Temp 98.2°F | Resp 16 | Ht 67.0 in | Wt 146.0 lb

## 2021-08-06 DIAGNOSIS — D49512 Neoplasm of unspecified behavior of left kidney: Secondary | ICD-10-CM | POA: Diagnosis not present

## 2021-08-06 DIAGNOSIS — Z01818 Encounter for other preprocedural examination: Secondary | ICD-10-CM

## 2021-08-06 DIAGNOSIS — Z01812 Encounter for preprocedural laboratory examination: Secondary | ICD-10-CM | POA: Diagnosis present

## 2021-08-06 HISTORY — DX: Malignant (primary) neoplasm, unspecified: C80.1

## 2021-08-06 LAB — CBC
HCT: 43.5 % (ref 39.0–52.0)
Hemoglobin: 14.3 g/dL (ref 13.0–17.0)
MCH: 30.2 pg (ref 26.0–34.0)
MCHC: 32.9 g/dL (ref 30.0–36.0)
MCV: 92 fL (ref 80.0–100.0)
Platelets: 225 10*3/uL (ref 150–400)
RBC: 4.73 MIL/uL (ref 4.22–5.81)
RDW: 12.1 % (ref 11.5–15.5)
WBC: 5.5 10*3/uL (ref 4.0–10.5)
nRBC: 0 % (ref 0.0–0.2)

## 2021-08-06 LAB — BASIC METABOLIC PANEL
Anion gap: 5 (ref 5–15)
BUN: 20 mg/dL (ref 6–20)
CO2: 30 mmol/L (ref 22–32)
Calcium: 9.4 mg/dL (ref 8.9–10.3)
Chloride: 102 mmol/L (ref 98–111)
Creatinine, Ser: 1.27 mg/dL — ABNORMAL HIGH (ref 0.61–1.24)
GFR, Estimated: >60 mL/min (ref >60)
Glucose, Bld: 88 mg/dL (ref 70–99)
Potassium: 4.6 mmol/L (ref 3.5–5.1)
Sodium: 137 mmol/L (ref 135–145)

## 2021-08-20 ENCOUNTER — Ambulatory Visit (HOSPITAL_COMMUNITY): Payer: 59 | Admitting: Anesthesiology

## 2021-08-20 ENCOUNTER — Encounter (HOSPITAL_COMMUNITY): Payer: Self-pay | Admitting: Surgery

## 2021-08-20 ENCOUNTER — Ambulatory Visit (HOSPITAL_COMMUNITY)
Admission: RE | Admit: 2021-08-20 | Discharge: 2021-08-20 | Disposition: A | Discharge status: Home / Self Care | Payer: 59 | Attending: Surgery | Admitting: Surgery

## 2021-08-20 ENCOUNTER — Ambulatory Visit (HOSPITAL_COMMUNITY): Payer: 59 | Admitting: Physician Assistant

## 2021-08-20 ENCOUNTER — Encounter (HOSPITAL_COMMUNITY)
Admission: RE | Disposition: A | Discharge status: Home / Self Care | Payer: Self-pay | Source: Home / Self Care | Attending: Surgery

## 2021-08-20 DIAGNOSIS — K432 Incisional hernia without obstruction or gangrene: Secondary | ICD-10-CM | POA: Insufficient documentation

## 2021-08-20 DIAGNOSIS — Z905 Acquired absence of kidney: Secondary | ICD-10-CM | POA: Diagnosis not present

## 2021-08-20 DIAGNOSIS — Z85528 Personal history of other malignant neoplasm of kidney: Secondary | ICD-10-CM | POA: Insufficient documentation

## 2021-08-20 DIAGNOSIS — K439 Ventral hernia without obstruction or gangrene: Secondary | ICD-10-CM | POA: Insufficient documentation

## 2021-08-20 HISTORY — PX: VENTRAL HERNIA REPAIR: SHX424

## 2021-08-20 SURGERY — REPAIR, HERNIA, VENTRAL, LAPAROSCOPIC
Anesthesia: General | Site: Abdomen

## 2021-08-20 MED ORDER — CHLORHEXIDINE GLUCONATE CLOTH 2 % EX PADS: 6.0000 | MEDICATED_PAD | Freq: Once | CUTANEOUS | Status: DC

## 2021-08-20 MED ORDER — OXYCODONE HCL 5 MG PO TABS: 5.0000 mg | ORAL_TABLET | Freq: Once | ORAL | Status: AC | PRN

## 2021-08-20 MED ORDER — OXYCODONE HCL 5 MG PO TABS: 5.0000 mg | ORAL_TABLET | ORAL | Status: DC | PRN

## 2021-08-20 MED ORDER — BUPIVACAINE LIPOSOME 1.3 % IJ SUSP
INTRAMUSCULAR | Status: AC
  Filled 2021-08-20: qty 20

## 2021-08-20 MED ORDER — DEXAMETHASONE SODIUM PHOSPHATE 10 MG/ML IJ SOLN
INTRAMUSCULAR | Status: AC
  Filled 2021-08-20: qty 1

## 2021-08-20 MED ORDER — ENSURE PRE-SURGERY PO LIQD: 592.0000 mL | Freq: Once | ORAL | Status: DC

## 2021-08-20 MED ORDER — KETAMINE HCL 10 MG/ML IJ SOLN
INTRAMUSCULAR | Status: DC | PRN
  Administered 2021-08-20: 35 mg via INTRAVENOUS

## 2021-08-20 MED ORDER — OXYCODONE HCL 5 MG PO TABS: 5.0000 mg | ORAL_TABLET | Freq: Four times a day (QID) | ORAL | 0 refills | Status: AC | PRN

## 2021-08-20 MED ORDER — MIDAZOLAM HCL 2 MG/2ML IJ SOLN
INTRAMUSCULAR | Status: AC
  Filled 2021-08-20: qty 2

## 2021-08-20 MED ORDER — FENTANYL CITRATE PF 50 MCG/ML IJ SOSY
PREFILLED_SYRINGE | INTRAMUSCULAR | Status: AC
  Administered 2021-08-20: 17:00:00 50 ug via INTRAVENOUS
  Filled 2021-08-20: qty 2

## 2021-08-20 MED ORDER — ROCURONIUM BROMIDE 10 MG/ML (PF) SYRINGE
PREFILLED_SYRINGE | INTRAVENOUS | Status: AC
  Filled 2021-08-20: qty 10

## 2021-08-20 MED ORDER — AMISULPRIDE (ANTIEMETIC) 5 MG/2ML IV SOLN
10.0000 mg | Freq: Once | INTRAVENOUS | Status: AC | PRN
  Administered 2021-08-20: 17:00:00 10 mg via INTRAVENOUS

## 2021-08-20 MED ORDER — BUPIVACAINE-EPINEPHRINE (PF) 0.25% -1:200000 IJ SOLN
INTRAMUSCULAR | Status: AC
  Filled 2021-08-20: qty 30

## 2021-08-20 MED ORDER — PHENYLEPHRINE 40 MCG/ML (10ML) SYRINGE FOR IV PUSH (FOR BLOOD PRESSURE SUPPORT)
PREFILLED_SYRINGE | INTRAVENOUS | Status: DC | PRN
  Administered 2021-08-20: 80 ug via INTRAVENOUS
  Administered 2021-08-20: 120 ug via INTRAVENOUS

## 2021-08-20 MED ORDER — FENTANYL CITRATE PF 50 MCG/ML IJ SOSY
25.0000 ug | PREFILLED_SYRINGE | INTRAMUSCULAR | Status: DC | PRN
  Administered 2021-08-20 (×2): 50 ug via INTRAVENOUS

## 2021-08-20 MED ORDER — CELECOXIB 200 MG PO CAPS
200.0000 mg | ORAL_CAPSULE | ORAL | Status: AC
  Administered 2021-08-20: 11:00:00 200 mg via ORAL
  Filled 2021-08-20: qty 1

## 2021-08-20 MED ORDER — FENTANYL CITRATE (PF) 250 MCG/5ML IJ SOLN
INTRAMUSCULAR | Status: AC
  Filled 2021-08-20: qty 5

## 2021-08-20 MED ORDER — ROCURONIUM BROMIDE 10 MG/ML (PF) SYRINGE
PREFILLED_SYRINGE | INTRAVENOUS | Status: DC | PRN
  Administered 2021-08-20: 60 mg via INTRAVENOUS
  Administered 2021-08-20: 20 mg via INTRAVENOUS
  Administered 2021-08-20: 10 mg via INTRAVENOUS

## 2021-08-20 MED ORDER — OXYCODONE HCL 5 MG/5ML PO SOLN: 5.0000 mg | Freq: Once | ORAL | Status: AC | PRN

## 2021-08-20 MED ORDER — PROMETHAZINE HCL 25 MG/ML IJ SOLN: 6.2500 mg | INTRAMUSCULAR | Status: DC | PRN

## 2021-08-20 MED ORDER — PROPOFOL 10 MG/ML IV BOLUS
INTRAVENOUS | Status: AC
  Filled 2021-08-20: qty 20

## 2021-08-20 MED ORDER — SODIUM CHLORIDE 0.9 % IV SOLN: 250.0000 mL | INTRAVENOUS | Status: DC | PRN

## 2021-08-20 MED ORDER — METHOCARBAMOL 500 MG PO TABS
500.0000 mg | ORAL_TABLET | Freq: Once | ORAL | Status: AC
  Administered 2021-08-20: 18:00:00 500 mg via ORAL

## 2021-08-20 MED ORDER — LIP MEDEX EX OINT: 1.0000 "application " | TOPICAL_OINTMENT | Freq: Two times a day (BID) | CUTANEOUS | Status: DC

## 2021-08-20 MED ORDER — ENSURE PRE-SURGERY PO LIQD: 296.0000 mL | Freq: Once | ORAL | Status: DC

## 2021-08-20 MED ORDER — PROPOFOL 10 MG/ML IV BOLUS
INTRAVENOUS | Status: DC | PRN
  Administered 2021-08-20: 150 mg via INTRAVENOUS

## 2021-08-20 MED ORDER — ACETAMINOPHEN 500 MG PO TABS: 1000.0000 mg | ORAL_TABLET | Freq: Four times a day (QID) | ORAL | Status: DC

## 2021-08-20 MED ORDER — BUPIVACAINE LIPOSOME 1.3 % IJ SUSP: 20.0000 mL | Freq: Once | INTRAMUSCULAR | Status: DC

## 2021-08-20 MED ORDER — EPHEDRINE 5 MG/ML INJ
INTRAVENOUS | Status: AC
  Filled 2021-08-20: qty 5

## 2021-08-20 MED ORDER — TRAMADOL HCL 50 MG PO TABS: 50.0000 mg | ORAL_TABLET | Freq: Four times a day (QID) | ORAL | 0 refills | Status: DC | PRN

## 2021-08-20 MED ORDER — CEFAZOLIN SODIUM-DEXTROSE 2-4 GM/100ML-% IV SOLN
2.0000 g | INTRAVENOUS | Status: AC
  Administered 2021-08-20: 14:00:00 2 g via INTRAVENOUS
  Filled 2021-08-20: qty 100

## 2021-08-20 MED ORDER — FENTANYL CITRATE (PF) 100 MCG/2ML IJ SOLN
INTRAMUSCULAR | Status: DC | PRN
  Administered 2021-08-20 (×5): 50 ug via INTRAVENOUS

## 2021-08-20 MED ORDER — METHOCARBAMOL 500 MG PO TABS
ORAL_TABLET | ORAL | Status: AC
  Filled 2021-08-20: qty 1

## 2021-08-20 MED ORDER — LACTATED RINGERS IV SOLN: INTRAVENOUS | Status: DC

## 2021-08-20 MED ORDER — CHLORHEXIDINE GLUCONATE 0.12 % MT SOLN
15.0000 mL | Freq: Once | OROMUCOSAL | Status: AC
  Administered 2021-08-20: 11:00:00 15 mL via OROMUCOSAL

## 2021-08-20 MED ORDER — LIDOCAINE 2% (20 MG/ML) 5 ML SYRINGE
INTRAMUSCULAR | Status: DC | PRN
Start: 2021-08-20 — End: 2021-08-20
  Administered 2021-08-20: 50 mg via INTRAVENOUS

## 2021-08-20 MED ORDER — SUGAMMADEX SODIUM 200 MG/2ML IV SOLN
INTRAVENOUS | Status: DC | PRN
Start: 2021-08-20 — End: 2021-08-20
  Administered 2021-08-20: 150 mg via INTRAVENOUS

## 2021-08-20 MED ORDER — SODIUM CHLORIDE 0.9% FLUSH: 3.0000 mL | Freq: Two times a day (BID) | INTRAVENOUS | Status: DC

## 2021-08-20 MED ORDER — ONDANSETRON HCL 4 MG/2ML IJ SOLN
INTRAMUSCULAR | Status: AC
  Filled 2021-08-20: qty 2

## 2021-08-20 MED ORDER — GABAPENTIN 300 MG PO CAPS
300.0000 mg | ORAL_CAPSULE | ORAL | Status: AC
  Administered 2021-08-20: 11:00:00 300 mg via ORAL
  Filled 2021-08-20: qty 1

## 2021-08-20 MED ORDER — ONDANSETRON HCL 4 MG/2ML IJ SOLN
INTRAMUSCULAR | Status: DC | PRN
  Administered 2021-08-20: 4 mg via INTRAVENOUS

## 2021-08-20 MED ORDER — SODIUM CHLORIDE 0.9% FLUSH: 3.0000 mL | INTRAVENOUS | Status: DC | PRN

## 2021-08-20 MED ORDER — MIDAZOLAM HCL 5 MG/5ML IJ SOLN
INTRAMUSCULAR | Status: DC | PRN
  Administered 2021-08-20: 2 mg via INTRAVENOUS

## 2021-08-20 MED ORDER — SCOPOLAMINE 1 MG/3DAYS TD PT72
1.0000 | MEDICATED_PATCH | TRANSDERMAL | Status: DC
  Administered 2021-08-20: 11:00:00 1.5 mg via TRANSDERMAL
  Filled 2021-08-20: qty 1

## 2021-08-20 MED ORDER — EPHEDRINE SULFATE-NACL 50-0.9 MG/10ML-% IV SOSY
PREFILLED_SYRINGE | INTRAVENOUS | Status: DC | PRN
Start: 2021-08-20 — End: 2021-08-20
  Administered 2021-08-20 (×4): 5 mg via INTRAVENOUS

## 2021-08-20 MED ORDER — LACTATED RINGERS IR SOLN
Status: DC | PRN
  Administered 2021-08-20: 1000 mL

## 2021-08-20 MED ORDER — AMISULPRIDE (ANTIEMETIC) 5 MG/2ML IV SOLN
INTRAVENOUS | Status: AC
  Filled 2021-08-20: qty 4

## 2021-08-20 MED ORDER — FENTANYL CITRATE PF 50 MCG/ML IJ SOSY
PREFILLED_SYRINGE | INTRAMUSCULAR | Status: AC
  Filled 2021-08-20: qty 1

## 2021-08-20 MED ORDER — 0.9 % SODIUM CHLORIDE (POUR BTL) OPTIME
TOPICAL | Status: DC | PRN
  Administered 2021-08-20: 14:00:00 2000 mL

## 2021-08-20 MED ORDER — DEXAMETHASONE SODIUM PHOSPHATE 4 MG/ML IJ SOLN
INTRAMUSCULAR | Status: DC | PRN
  Administered 2021-08-20: 10 mg via INTRAVENOUS

## 2021-08-20 MED ORDER — LIDOCAINE HCL (PF) 2 % IJ SOLN
INTRAMUSCULAR | Status: AC
  Filled 2021-08-20: qty 5

## 2021-08-20 MED ORDER — OXYCODONE HCL 5 MG PO TABS
ORAL_TABLET | ORAL | Status: AC
  Filled 2021-08-20: qty 1

## 2021-08-20 MED ORDER — OXYCODONE HCL 5 MG PO TABS
ORAL_TABLET | ORAL | Status: AC
  Administered 2021-08-20: 17:00:00 5 mg via ORAL
  Filled 2021-08-20: qty 1

## 2021-08-20 MED ORDER — ACETAMINOPHEN 500 MG PO TABS
1000.0000 mg | ORAL_TABLET | ORAL | Status: AC
  Administered 2021-08-20: 11:00:00 1000 mg via ORAL
  Filled 2021-08-20: qty 2

## 2021-08-20 MED ORDER — OXYCODONE HCL 5 MG PO TABS
5.0000 mg | ORAL_TABLET | Freq: Once | ORAL | Status: AC
  Administered 2021-08-20: 18:00:00 5 mg via ORAL

## 2021-08-20 MED ORDER — METHOCARBAMOL 750 MG PO TABS: 750.0000 mg | ORAL_TABLET | Freq: Four times a day (QID) | ORAL | 2 refills | Status: AC | PRN

## 2021-08-20 MED ORDER — METHOCARBAMOL 1000 MG/10ML IJ SOLN
1000.0000 mg | Freq: Four times a day (QID) | INTRAVENOUS | Status: DC | PRN
  Filled 2021-08-20: qty 10

## 2021-08-20 MED ORDER — MAGIC MOUTHWASH
15.0000 mL | Freq: Four times a day (QID) | ORAL | Status: DC | PRN
  Filled 2021-08-20: qty 15

## 2021-08-20 MED ORDER — ORAL CARE MOUTH RINSE: 15.0000 mL | Freq: Once | OROMUCOSAL | Status: AC

## 2021-08-20 MED ORDER — PHENYLEPHRINE 40 MCG/ML (10ML) SYRINGE FOR IV PUSH (FOR BLOOD PRESSURE SUPPORT)
PREFILLED_SYRINGE | INTRAVENOUS | Status: AC
  Filled 2021-08-20: qty 10

## 2021-08-20 SURGICAL SUPPLY — 49 items
APL PRP STRL LF DISP 70% ISPRP (MISCELLANEOUS) ×2
APPLIER CLIP 5 13 M/L LIGAMAX5 (MISCELLANEOUS)
APR CLP MED LRG 5 ANG JAW (MISCELLANEOUS)
BAG COUNTER SPONGE SURGICOUNT (BAG) ×3 IMPLANT
BAG SPNG CNTER NS LX DISP (BAG) ×2
BAG SURGICOUNT SPONGE COUNTING (BAG) ×1
BINDER ABDOMINAL 12 ML 46-62 (SOFTGOODS) ×3 IMPLANT
CABLE HIGH FREQUENCY MONO STRZ (ELECTRODE) ×1 IMPLANT
CHLORAPREP W/TINT 26 (MISCELLANEOUS) ×4 IMPLANT
CLIP APPLIE 5 13 M/L LIGAMAX5 (MISCELLANEOUS) IMPLANT
CLOSURE STERI-STRIP 1/2X4 (GAUZE/BANDAGES/DRESSINGS) ×2
CLOSURE WOUND 1/2 X4 (GAUZE/BANDAGES/DRESSINGS)
CLSR STERI-STRIP ANTIMIC 1/2X4 (GAUZE/BANDAGES/DRESSINGS) ×4 IMPLANT
COVER SURGICAL LIGHT HANDLE (MISCELLANEOUS) ×4 IMPLANT
DECANTER SPIKE VIAL GLASS SM (MISCELLANEOUS) ×4 IMPLANT
DEVICE SECURE STRAP 25 ABSORB (INSTRUMENTS) ×3 IMPLANT
DEVICE TROCAR PUNCTURE CLOSURE (ENDOMECHANICALS) ×4 IMPLANT
DRAPE WARM FLUID 44X44 (DRAPES) ×4 IMPLANT
DRSG OPSITE POSTOP 4X6 (GAUZE/BANDAGES/DRESSINGS) ×3 IMPLANT
DRSG TEGADERM 2-3/8X2-3/4 SM (GAUZE/BANDAGES/DRESSINGS) ×9 IMPLANT
DRSG TEGADERM 4X4.75 (GAUZE/BANDAGES/DRESSINGS) ×7 IMPLANT
ELECT REM PT RETURN 15FT ADLT (MISCELLANEOUS) ×4 IMPLANT
GAUZE SPONGE 2X2 8PLY STRL LF (GAUZE/BANDAGES/DRESSINGS) ×1 IMPLANT
GLOVE SURG NEOPR MICRO LF SZ8 (GLOVE) ×4 IMPLANT
GLOVE SURG UNDER LTX SZ8 (GLOVE) ×4 IMPLANT
GOWN STRL REUS W/TWL XL LVL3 (GOWN DISPOSABLE) ×8 IMPLANT
IRRIG SUCT STRYKERFLOW 2 WTIP (MISCELLANEOUS) ×4
IRRIGATION SUCT STRKRFLW 2 WTP (MISCELLANEOUS) ×1 IMPLANT
KIT BASIN OR (CUSTOM PROCEDURE TRAY) ×4 IMPLANT
KIT TURNOVER KIT A (KITS) IMPLANT
MARKER SKIN DUAL TIP RULER LAB (MISCELLANEOUS) ×4 IMPLANT
MESH VENTRALIGHT ST 8X10 (Mesh General) ×3 IMPLANT
NDL SPNL 22GX3.5 QUINCKE BK (NEEDLE) IMPLANT
NEEDLE SPNL 22GX3.5 QUINCKE BK (NEEDLE) ×8 IMPLANT
PAD POSITIONING PINK XL (MISCELLANEOUS) ×4 IMPLANT
PENCIL SMOKE EVACUATOR (MISCELLANEOUS) IMPLANT
SCISSORS LAP 5X35 DISP (ENDOMECHANICALS) ×4 IMPLANT
SET TUBE SMOKE EVAC HIGH FLOW (TUBING) ×4 IMPLANT
SLEEVE ADV FIXATION 5X100MM (TROCAR) ×7 IMPLANT
SPONGE GAUZE 2X2 STER 10/PKG (GAUZE/BANDAGES/DRESSINGS) ×2
STRIP CLOSURE SKIN 1/2X4 (GAUZE/BANDAGES/DRESSINGS) ×2 IMPLANT
SUT MNCRL AB 4-0 PS2 18 (SUTURE) ×4 IMPLANT
SUT PDS AB 1 CT1 27 (SUTURE) ×8 IMPLANT
SUT PROLENE 1 CT 1 30 (SUTURE) ×20 IMPLANT
TOWEL OR 17X26 10 PK STRL BLUE (TOWEL DISPOSABLE) ×4 IMPLANT
TRAY LAPAROSCOPIC (CUSTOM PROCEDURE TRAY) ×4 IMPLANT
TROCAR ADV FIXATION 11X100MM (TROCAR) IMPLANT
TROCAR ADV FIXATION 5X100MM (TROCAR) ×4 IMPLANT
TROCAR BLADELESS OPT 5 100 (ENDOMECHANICALS) ×4 IMPLANT

## 2021-08-20 NOTE — Addendum Note (Signed)
Addendum  created 08/20/21 1749 by Pervis Hocking, DO   Order list changed, Pharmacy for encounter modified

## 2021-08-20 NOTE — Op Note (Signed)
08/20/2021  PATIENT:  Brandon Booker  60 y.o. male  Patient Care Team: Laurance Flatten, MD as PCP - General (Internal Medicine) Michael Boston, MD as Consulting Physician (General Surgery) Raynelle Bring, MD as Consulting Physician (Urology)  PRE-OPERATIVE DIAGNOSIS:  PERIUMBILICAL ABDOMINAL INCISIONAL WALL HERNIA  POST-OPERATIVE DIAGNOSIS:  PERIUMBILICAL INCISIONAL ABDOMINAL WALL HERNIA  PROCEDURE:   LAPAROSCOPIC REPAIR OF  INCISIONAL NEW ABDOMINAL WALL HERNIA WITH MESH  TAP BLOCK - BILATERAL  SURGEON:  Adin Hector, MD  ASSISTANT: Nurse   ANESTHESIA:     General  Regional TRANSVERSUS ABDOMINIS PLANE (TAP) nerve block for perioperative & postoperative pain control provided with liposomal bupivacaine (Experel) mixed with 0.25% bupivacaine as a Bilateral TAP block x 28mL each side at the level of the transverse abdominis & preperitoneal spaces along the flank at the anterior axillary line, from subcostal ridge to iliac crest under laparoscopic guidance   EBL:  Total I/O In: 1100 [I.V.:1000; IV Piggyback:100] Out: -   Per anesthesia record  Delay start of Pharmacological VTE agent (>24hrs) due to surgical blood loss or risk of bleeding:  no  DRAINS: none   SPECIMEN:  No Specimen  DISPOSITION OF SPECIMEN:  N/A  COUNTS:  YES  PLAN OF CARE: Discharge to home after PACU  PATIENT DISPOSITION:  PACU - hemodynamically stable.  INDICATION: Pleasant patient has developed a ventral wall abdominal hernia after nephrectomy. Recommendation was made for surgical repair  The anatomy & physiology of the abdominal wall was discussed. The pathophysiology of hernias was discussed. Natural history risks without surgery including progeressive enlargement, pain, incarceration & strangulation was discussed. Contributors to complications such as smoking, obesity, diabetes, prior surgery, etc were discussed.  I feel the risks of no intervention will lead to serious problems that  outweigh the operative risks; therefore, I recommended surgery to reduce and repair the hernia. I explained laparoscopic techniques with possible need for an open approach. I noted the probable use of mesh to patch and/or buttress the hernia repair.  Risks such as bleeding, infection, abscess, need for further treatment, heart attack, death, and other risks were discussed. I noted a good likelihood this will help address the problem. Goals of post-operative recovery were discussed as well. Possibility that this will not correct all symptoms was explained. I stressed the importance of low-impact activity, aggressive pain control, avoiding constipation, & not pushing through pain to minimize risk of post-operative chronic pain or injury. Possibility of reherniation especially with smoking, obesity, diabetes, immunosuppression, and other health conditions was discussed. We will work to minimize complications.   An educational handout further explaining the pathology & treatment options was given as well. Questions were answered. The patient expresses understanding & wishes to proceed with surgery.   OR FINDINGS: 12 x 8 cm periumbilical incisional hernia in the setting of moderate diastases recti.  Type of repair: Laparoscopic underlay repair.  Primary repair of largest hernia   Placement of mesh: Centrally intraperitoneal with edges tucked into RECTRORECTUS & preperitoneal space  Name of mesh: Bard Ventralight dual sided (polypropylene / Seprafilm)  Size of mesh: 25x20cm  Orientation: Vertical  Mesh overlap:  5-7cm   DESCRIPTION:   Informed consent was confirmed. The patient underwent general anaesthesia without difficulty. The patient was positioned appropriately. VTE prevention in place. The patient's abdomen was clipped, prepped, & draped in a sterile fashion. Surgical timeout confirmed our plan.  The patient was positioned in reverse Trendelenburg. Abdominal entry was gained using optical  entry technique in the  left upper abdomen. Entry was clean. I induced carbon dioxide insufflation. Camera inspection revealed no injury. Extra ports were carefully placed under direct laparoscopic visualization.   I could see the hernia on the parietal peritoneum under the abdominal wall.   It was larger than anticipated given the fact he had moderate diastases recti.  I freed off the falciform ligament and central peritoneum to expose the retrorectus fascia.  I freed off the posterior rectus fascia to the lateral rectus sheath on both sides.  His hernia sac and skin were densely adherent to each other with moderate scar.  I cannot freely seen that off so I had to take peritoneum on the lateral part of the hernia in the center hernia sac cannot be brought down intact.  I freed off peritoneum and posterior rectus fascia down to the pubic tubercle and up to the xiphoid for good exposure.   I was concerned initially that I would not be able to get this together primarily.  Was going to do a component separation laparoscopically but after relaxing found the fascia could come to better relatively well so I held off.   I made sure hemostasis was good.  I mapped out the region using a needle passer.   To ensure that I would have at least 5 cm radial coverage outside of the hernia defect, I chose a 25x20cm dual sided mesh.  I placed #1 Prolene stitches around its edge about every 5 cm = 12 total.  I rolled the mesh & placed into the peritoneal cavity through the hernia defect.  I unrolled the mesh and positioned it appropriately.  I secured the mesh to cover up the hernia defect using a laparoscopic suture passer to pass the tails of the Prolene through the abdominal wall & tagged them with clamps for good transfascial suturing.  I started out in four corners to make sure I had the mesh centered under the hernia defect appropriately, and then proceeded to work in quadrants.    We evacuated CO2 & desufflated the  abdomen.  I opened up his periumbilical midline incision that had stricture with moderate hernia sac.  Freed hernia sac off the subcutaneous tissues.  I created flaps of skin and subcutaneous tissue to come off the anterior rectus fascia on both sides and confirm I could get the midline together.  I excised the midline scarred hernia sac until I came to good anterior rectus fascia on either side.  It could come together.  I mobilized some subcutaneous flaps on both sides for better exposure and identification anterior fascia.  I primarily closed the hernia using #1 running PDS from each corner meeting in the middle.  Did take bites of the mesh at the midline help centrally secure the mesh as well.  Tied that down for good closure.  Did not feel he needed to do component separation, so I tied the peripheral Prolene transfascial mesh sutures down.   I reinsufflated the abdomen. The mesh provided at least circumferential coverage around the entire region of hernia defects.  I tacked the edges & central part of the mesh to the peritoneum/posterior rectus fascia with SecureStrap absorbable tacks.   I used the tacks to help retract the peritoneum and posterior rectus fascia to help cover up the lateral superior and inferior edges of the mesh such that a much smaller central part of the mesh was actually exposed to the peritoneum.  I did reinspection. Hemostasis was good. Mesh laid well. I completed  a broad field block of local anesthesia at fascial stitch sites & fascial closure areas.    Capnoperitoneum was evacuated. Ports were removed.  Patient had rather redundant skin from his hernia sac so I excised primarily the left side where most of the hernia sac had been.  Freed the umbilical stalk off the fascia and excised some skin on the right side as well.  This helped it come together much more flat and minimized dead space.  Recheck the umbilicus with 0 Vicryl suture.  Closed the midline incision with 3-0 Vicryl  deep dermal interrupted sutures.  The skin was closed with Monocryl at the subcuticular level of the midline incision as well as the port sites.  Steri-Strips on the fascial stitch puncture sites.  Patient is being extubated to go to the recovery room.  I discussed operative findings, updated the patient's status, discussed probable steps to recovery, and gave postoperative recommendations to the patient's spouse, Kyen Taite.  Recommendations were made.  Questions were answered.  She expressed understanding & appreciation.  Adin Hector, M.D., F.A.C.S. Gastrointestinal and Minimally Invasive Surgery [facility] [address] [place], [place] [address] [phone] Main / Paging  08/20/2021 4:17 PM

## 2021-08-20 NOTE — H&P (Signed)
08/20/2021    REFERRING PHYSICIAN: Borden, Lester Stuart J*  Patient Care Team: Carter, Sarah Margaret, MD as PCP - General (Internal Medicine) Borden, Lester Stuart Jr., MD (Urology) Gross, Steven Cummins, MD as Consulting Provider (General Surgery)  PROVIDER: STEVEN CUMMINS GROSS, MD  [facility] MRN: [id number] DOB: 10/31/1960  Subjective   Chief Complaint: Hernia   History of Present Illness: Brandon Booker is a 59 y.o. male who is seen today as an office consultation at the request of Dr. Borden for evaluation of Hernia .   Pleasant active male. Required robotic left radical nephrectomy in February 2022 by Dr. Borden. 11.4 cm mass. Patient felt some bulging and discomfort this summer while exercising. It is gotten larger. Concerning. Discussed with [facility]. Physician assistant Spencer suspected hernia. CAT scan suspicious for it as well.  Patient does not smoke. He does mountain biking and yoga. He is noted some increasing bulging over the past few months. Some discomfort with that as well. No fevers or chills. He usually moves his bowels every day. No cardiac or pulmonary issues. Not any blood thinners. Creatinine 0.6 after resection. Patient concerning giving the size and discomfort. Not to the point of nausea vomiting. Usually moves his bowels every day. No smoking no diabetes no sleep apnea. He hikes and walks 5 to 10 miles without any difficulty  No new events - ready for surgery.     Medical History: Past Medical History:  Diagnosis Date   History of cancer   There is no problem list on file for this patient.  Past Surgical History:  Procedure Laterality Date   left kidney removal Left    No Known Allergies  No current outpatient medications on file prior to visit.   No current facility-administered medications on file prior to visit.   Family History  Problem Relation Age of Onset   Stroke Mother   Skin cancer Mother   High blood pressure  (Hypertension) Mother   High blood pressure (Hypertension) Father   High blood pressure (Hypertension) Sister   Stroke Brother   High blood pressure (Hypertension) Brother    Social History   Tobacco Use  Smoking Status Never Smoker  Smokeless Tobacco Never Used    Social History   Socioeconomic History   Marital status: Married  Tobacco Use   Smoking status: Never Smoker   Smokeless tobacco: Never Used  Vaping Use   Vaping Use: Never used  Substance and Sexual Activity   Alcohol use: Never   Drug use: Never   ############################################################  Review of Systems: A complete review of systems (ROS) was obtained from the patient. I have reviewed this information and discussed as appropriate with the patient. See HPI as well for other pertinent ROS.  Constitutional: No fevers, chills, sweats. Weight stable Eyes: No vision changes, No discharge HENT: No sore throats, nasal drainage Lymph: No neck swelling, No bruising easily Pulmonary: No cough, productive sputum CV: No orthopnea, PND Patient walks 90 minutes for about 5 miles without difficulty. No exertional chest/neck/shoulder/arm pain.  GI: No personal nor family history of GI/colon cancer, inflammatory bowel disease, irritable bowel syndrome, allergy such as Celiac Sprue, dietary/dairy problems, colitis, ulcers nor gastritis. No recent sick contacts/gastroenteritis. No travel outside the country. No changes in diet.  Renal: No UTIs, No hematuria Genital: No drainage, bleeding, masses Musculoskeletal: No severe joint pain. Good ROM major joints Skin: No sores or lesions Heme/Lymph: No easy bleeding. No swollen lymph nodes  Objective:   Vitals:  07/28/21   1101  BP: 118/80  Pulse: 86  Temp: 36.7 C (98 F)  SpO2: 99%  Weight: 68 kg (150 lb)  Height: 170.2 cm (5' 7")    Body mass index is 23.49 kg/m.  BP (!) 153/81   Pulse 64   Temp 98.2 F (36.8 C)   Resp 16   Ht 5' 7" (1.702  m)   Wt 68 kg   SpO2 98%   BMI 23.49 kg/m  08/20/2021   PHYSICAL EXAM:  Constitutional: Not cachectic. Hygeine adequate. Vitals signs as above.  Eyes: Pupils reactive, normal extraocular movements. Sclera nonicteric Neuro: CN II-XII intact. No major focal sensory defects. No major motor deficits. Lymph: No head/neck/groin lymphadenopathy Psych: No severe agitation. No severe anxiety. Judgment & insight Adequate, Oriented x4, HENT: Normocephalic, Mucus membranes moist. No thrush.  Neck: Supple, No tracheal deviation. No obvious thyromegaly Chest: No pain to chest wall compression. Good respiratory excursion. No audible wheezing CV: Pulses intact. Regular rhythm. No major extremity edema  Abdomen: Flat Hernia: Supraumbilical bulging the setting of diastases and left supraumbilical vertical incision. Moderate bulging. Diastasis recti: Large supraumbilical midline. Soft. Nondistended. Nontender. No hepatomegaly. No splenomegaly  Gen: Inguinal hernia: Not present. Inguinal lymph nodes: without lymphadenopathy.   Rectal: (Deferred)  Ext: No obvious deformity or contracture. Edema: Not present. No cyanosis Skin: No major subcutaneous nodules. Warm and dry Musculoskeletal: Severe joint rigidity not present. No obvious clubbing. No digital petechiae.   Labs, Imaging and Diagnostic Testing:  Located in 'Care Everywhere' section of Epic EMR chart  PRIOR NOTES   Not applicable  SURGERY NOTES:  Located in 'Care Everywhere' section of Epic EMR chart  PATHOLOGY:  Located in 'Care Everywhere' section of Epic EMR chart  Assessment and Plan:  DIAGNOSES:  Diagnoses and all orders for this visit:  Incisional hernia, without obstruction or gangrene  History of renal cell cancer  Diastasis recti    ASSESSMENT/PLAN  Pleasant active male with diastases requiring robotic resection of large left renal cell cancer on the left side. No evidence of any recurrence.  Unfortunately  has developed incisional hernia. Moderate size. I think he would benefit from surgical repair. We will plan laparoscopic lysis of  adhesions with underlay repair and probable primary closure over this. Hopefully this can be an outpatient surgery but we will have to see.  The anatomy & physiology of the abdominal wall was discussed.  The pathophysiology of hernias was discussed.  Natural history risks without surgery including progeressive enlargement, pain, incarceration, & strangulation was discussed.   Contributors to complications such as smoking, obesity, diabetes, prior surgery, etc were discussed.   I feel the risks of no intervention will lead to serious problems that outweigh the operative risks; therefore, I recommended surgery to reduce and repair the hernia.  I explained laparoscopic techniques with possible need for an open approach.  I noted the probable use of mesh to patch and/or buttress the hernia repair  Risks such as bleeding, infection, abscess, need for further treatment, injury to other organs, need for repair of tissues / organs, stroke, heart attack, death, and other risks were discussed.  I noted a good likelihood this will help address the problem.   Goals of post-operative recovery were discussed as well.  Possibility that this will not correct all symptoms was explained.  I stressed the importance of low-impact activity, aggressive pain control, avoiding constipation, & not pushing through pain to minimize risk of post-operative chronic pain or injury. Possibility of reherniation especially   with smoking, obesity, diabetes, immunosuppression, and other health conditions was discussed.  We will work to minimize complications.     An educational handout further explaining the pathology & treatment options was given as well.  Questions were answered.  The patient expresses understanding & wishes to proceed with surgery.    He is having some discomfort with the bulging. Wrote a  prescription for an abdominal binder. Hopefully that will help stabilize his core.  He is hoping to get this done before the end of the year since he is already met his deductible and he is having some discomfort. We are getting to crunch time where it is harder to fit people in but we will try.  He otherwise has great exercise tolerance and got through the nephrectomy without any issues with normal renal function. No cardiopulmonary issues. I do not think he needs more aggressive clearance.  FOLLOWUP: Return for Plan to schedule surgery. See instructions.   Steven C. Gross, MD, FACS, MASCRS Esophageal, Gastrointestinal & Colorectal Surgery Robotic and Minimally Invasive Surgery  [facility]  [facility]  [address], [address] [place], [place] [address] [phone] Fax [phone] Main  CONTACT INFORMATION:  Weekday (9AM-5PM): Call [facility] main office at [phone]  Weeknight (5PM-9AM) or Weekend/Holiday: Check [email or web address] (password " [email or web address]") for General Surgery [facility] coverage  (Please, do not use SecureChat as it is not reliable communication to operating surgeons for immediate patient care)    08/20/2021  

## 2021-08-20 NOTE — Discharge Instructions (Signed)
HERNIA REPAIR: POST OP INSTRUCTIONS  ######################################################################  EAT Gradually transition to a high fiber diet with a fiber supplement over the next few weeks after discharge.  Start with a pureed / full liquid diet (see below)  WALK Walk an hour a day.  Control your pain to do that.    CONTROL PAIN Control pain so that you can walk, sleep, tolerate sneezing/coughing, and go up/down stairs.  HAVE A BOWEL MOVEMENT DAILY Keep your bowels regular to avoid problems.  OK to try a laxative to override constipation.  OK to use an antidairrheal to slow down diarrhea.  Call if not better after 2 tries  CALL IF YOU HAVE PROBLEMS/CONCERNS Call if you are still struggling despite following these instructions. Call if you have concerns not answered by these instructions  ######################################################################    DIET: Follow a light bland diet & liquids the first 24 hours after arrival home, such as soup, liquids, starches, etc.  Be sure to drink plenty of fluids.  Quickly advance to a usual solid diet within a few days.  Avoid fast food or heavy meals as your are more likely to get nauseated or have irregular bowels.  A low-fat, high-fiber diet for the rest of your life is ideal.   Take your usually prescribed home medications unless otherwise directed.  PAIN CONTROL: Pain is best controlled by a usual combination of three different methods TOGETHER: Ice/Heat Over the counter pain medication Prescription pain medication Most patients will experience some swelling and bruising around the hernia(s) such as the bellybutton, groins, or old incisions.  Ice packs or heating pads (30-60 minutes up to 6 times a day) will help. Use ice for the first few days to help decrease swelling and bruising, then switch to heat to help relax tight/sore spots and speed recovery.  Some people prefer to use ice alone, heat alone, alternating  between ice & heat.  Experiment to what works for you.  Swelling and bruising can take several weeks to resolve.   It is helpful to take an over-the-counter pain medication regularly for the first few weeks.  Choose one of the following that works best for you: Naproxen (Aleve, etc)  Two 220mg tabs twice a day Ibuprofen (Advil, etc) Three 200mg tabs four times a day (every meal & bedtime) Acetaminophen (Tylenol, etc) 325-650mg four times a day (every meal & bedtime) A  prescription for pain medication should be given to you upon discharge.  Take your pain medication as prescribed.  If you are having problems/concerns with the prescription medicine (does not control pain, nausea, vomiting, rash, itching, etc), please call us [phone] to see if we need to switch you to a different pain medicine that will work better for you and/or control your side effect better. If you need a refill on your pain medication, please contact your pharmacy.  They will contact our office to request authorization. Prescriptions will not be filled after 5 pm or on week-ends.  Avoid getting constipated.  Between the surgery and the pain medications, it is common to experience some constipation.  Increasing fluid intake and taking a fiber supplement (such as Metamucil, Citrucel, FiberCon, MiraLax, etc) 1-2 times a day regularly will usually help prevent this problem from occurring.  A mild laxative (prune juice, Milk of Magnesia, MiraLax, etc) should be taken according to package directions if there are no bowel movements after 48 hours.    Wash / shower every day.  You may shower over the dressings   as they are waterproof.    Remove your waterproof bandages, skin tapes, and other bandages 3 days after surgery. You may replace a dressing/Band-Aid to cover the incision for comfort if you wish. You may leave the incisions open to air.  You may replace a dressing/Band-Aid to cover an incision for comfort if you wish.  Continue  to shower over incision(s) after the dressing is off.  ACTIVITIES as tolerated:   You may resume regular (light) daily activities beginning the next day--such as daily self-care, walking, climbing stairs--gradually increasing activities as tolerated.  Control your pain so that you can walk an hour a day.  If you can walk 30 minutes without difficulty, it is safe to try more intense activity such as jogging, treadmill, bicycling, low-impact aerobics, swimming, etc. Save the most intensive and strenuous activity for last such as sit-ups, heavy lifting, contact sports, etc  Refrain from any heavy lifting or straining until you are off narcotics for pain control.   DO NOT PUSH THROUGH PAIN.  Let pain be your guide: If it hurts to do something, don't do it.  Pain is your body warning you to avoid that activity for another week until the pain goes down. You may drive when you are no longer taking prescription pain medication, you can comfortably wear a seatbelt, and you can safely maneuver your car and apply brakes. You may have sexual intercourse when it is comfortable.   FOLLOW UP in our office Please call [facility] at [phone] to set up an appointment to see your surgeon in the office for a follow-up appointment approximately 2-3 weeks after your surgery. Make sure that you call for this appointment the day you arrive home to insure a convenient appointment time.  9.  If you have disability of FMLA / Family leave forms, please bring the forms to the office for processing.  (do not give to your surgeon).  WHEN TO CALL US [phone]: Poor pain control Reactions / problems with new medications (rash/itching, nausea, etc)  Fever over 101.5 F (38.5 C) Inability to urinate Nausea and/or vomiting Worsening swelling or bruising Continued bleeding from incision. Increased pain, redness, or drainage from the incision   The clinic staff is available to answer your questions during regular business  hours (8:30am-5pm).  Please don't hesitate to call and ask to speak to one of our nurses for clinical concerns.   If you have a medical emergency, go to the nearest emergency room or call 911.  A surgeon from [facility] is always on call at the hospitals in [place]  [facility] [address], [address], [place], [place]  [address] ?  [address] [address], [place], [place]   [address] MAIN: [phone] ? TOLL FREE: [phone] ? FAX: [phone] [email or web address]  

## 2021-08-20 NOTE — Interval H&P Note (Signed)
History and Physical Interval Note:  08/20/2021 12:45 PM  Earleen Newport  has presented today for surgery, with the diagnosis of PERIUMBILICAL ABDOMINAL INCISIONAL WALL HERNIAE.  The various methods of treatment have been discussed with the patient and family. After consideration of risks, benefits and other options for treatment, the patient has consented to  Procedure(s): [place] (N/A) LYSIS OF ADHESION (N/A) as a surgical intervention.  The patient's history has been reviewed, patient examined, no change in status, stable for surgery.  I have reviewed the patient's chart and labs.  Questions were answered to the patient's satisfaction.    I have re-reviewed the the patient's records, history, medications, and allergies.  I have re-examined the patient.  I again discussed intraoperative plans and goals of post-operative recovery.  The patient agrees to proceed.  REDA GETTIS  Apr 10, 1961 [id number]  Patient Care Team: Laurance Flatten, MD as PCP - General (Internal Medicine)  Patient Active Problem List   Diagnosis Date Noted   Neoplasm of left kidney 11/13/2020   Renal mass 07/24/2020   Left renal mass 07/23/2020    Past Medical History:  Diagnosis Date   Cancer [facility])    left kidney cancer   Medical history non-contributory    PONV (postoperative nausea and vomiting)    Renal mass     Past Surgical History:  Procedure Laterality Date   BACK SURGERY  age 47  years old   IR EMBO ART  VEN HEMORR LYMPH EXTRAV  INC GUIDE ROADMAPPING  07/25/2020   IR RENAL SELECTIVE  UNI INC S&I MOD SED  07/25/2020   IR RENAL SUPRASEL UNI S&I MOD SED  07/25/2020   IR US GUIDE VASC ACCESS RIGHT  07/25/2020   LAPAROSCOPIC NEPHRECTOMY Left 11/13/2020   Procedure: LAPAROSCOPIC RADICAL NEPHRECTOMY;  Surgeon: Raynelle Bring, MD;  Location: [facility];  Service: Urology;  Laterality: Left;   ROTATOR CUFF REPAIR Bilateral    TONSILLECTOMY      Social History    Socioeconomic History   Marital status: Married    Spouse name: Not on file   Number of children: Not on file   Years of education: Not on file   Highest education level: Not on file  Occupational History   Not on file  Tobacco Use   Smoking status: Never   Smokeless tobacco: Never  Vaping Use   Vaping Use: Never used  Substance and Sexual Activity   Alcohol use: Yes    Comment: occasional beer - approx 1 beer per month   Drug use: Never   Sexual activity: Not on file  Other Topics Concern   Not on file  Social History Narrative   Not on file   Social Determinants of Health   Financial Resource Strain: Not on file  Food Insecurity: Not on file  Transportation Needs: Not on file  Physical Activity: Not on file  Stress: Not on file  Social Connections: Not on file  Intimate Partner Violence: Not on file    History reviewed. No pertinent family history.  Medications Prior to Admission  Medication Sig Dispense Refill Last Dose   docusate sodium (COLACE) 100 MG capsule Take 1 capsule (100 mg total) by mouth 2 (two) times daily. 60 capsule 0 08/20/2021 at 0700   acetaminophen (TYLENOL) 325 MG tablet Take 2 tablets (650 mg total) by mouth every 6 (six) hours as needed for mild pain (or Fever >/= 101). (Patient not taking: No sig reported) 30 tablet  0 Not Taking   Ferrous Sulfate (IRON) 325 (65 Fe) MG TABS Take 1 tablet (325 mg total) by mouth daily. (Patient not taking: No sig reported) 30 tablet 0 Not Taking   polyethylene glycol (MIRALAX / GLYCOLAX) 17 g packet Take 17 g by mouth daily as needed for moderate constipation. (Patient not taking: No sig reported) 14 each 0 Not Taking   traMADol (ULTRAM) 50 MG tablet Take 1-2 tablets (50-100 mg total) by mouth every 6 (six) hours as needed for moderate pain or severe pain. (Patient not taking: No sig reported) 20 tablet 0 Not Taking    Current Facility-Administered Medications  Medication Dose Route Frequency Provider Last  Rate Last Admin   bupivacaine liposome (EXPAREL) 1.3 % injection 266 mg  20 mL Infiltration Once Michael Boston, MD       ceFAZolin (ANCEF) IVPB 2g/100 mL premix  2 g Intravenous On Call to OR Michael Boston, MD       Chlorhexidine Gluconate Cloth 2 % PADS 6 each  6 each Topical Once Michael Boston, MD       lactated ringers infusion   Intravenous Continuous Nolon Nations, MD 10 mL/hr at 08/20/21 1125 New Bag at 08/20/21 1125   scopolamine (TRANSDERM-SCOP) 1 MG/3DAYS 1.5 mg  1 patch Transdermal Q72H Merlinda Frederick, MD   1.5 mg at 08/20/21 1121     No Known Allergies  BP (!) 153/81   Pulse 64   Temp 98.2 F (36.8 C)   Resp 16   Ht 5\' 7"  (1.702 m)   Wt 68 kg   SpO2 98%   BMI 23.49 kg/m   Labs: No results found for this or any previous visit (from the past 48 hour(s)).  Imaging / Studies: No results found.   Adin Hector, M.D., F.A.C.S. Gastrointestinal and Minimally Invasive Surgery [facility] [address] [place], [place] [address] [phone] Main / Paging  08/20/2021 12:45 PM    Adin Hector

## 2021-08-20 NOTE — Anesthesia Procedure Notes (Signed)
Procedure Name: Intubation Date/Time: 08/20/2021 1:40 PM Performed by: Claudia Desanctis, CRNA Pre-anesthesia Checklist: Emergency Drugs available, Suction available, Patient identified and Patient being monitored Patient Re-evaluated:Patient Re-evaluated prior to induction Oxygen Delivery Method: Circle system utilized Preoxygenation: Pre-oxygenation with 100% oxygen Induction Type: IV induction Ventilation: Mask ventilation without difficulty Laryngoscope Size: Glidescope and 4 Grade View: Grade I Tube type: Oral Tube size: 7.5 mm Number of attempts: 1 Airway Equipment and Method: Video-laryngoscopy Placement Confirmation: ETT inserted through vocal cords under direct vision, positive ETCO2 and breath sounds checked- equal and bilateral Secured at: 24 cm Tube secured with: Tape Dental Injury: Teeth and Oropharynx as per pre-operative assessment  Comments: Used due to pt 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

## 2021-08-20 NOTE — Anesthesia Postprocedure Evaluation (Signed)
Anesthesia Post Note  Patient: Brandon Booker  Procedure(s) Performed: LAPAROSCOPIC VENTRAL HERNIA REPAIR WITH MESH AND BILATERAL TAP BLOCK (Abdomen)     Patient location during evaluation: PACU Anesthesia Type: General Level of consciousness: awake and alert Pain management: pain level controlled Vital Signs Assessment: post-procedure vital signs reviewed and stable Respiratory status: spontaneous breathing, nonlabored ventilation and respiratory function stable Cardiovascular status: blood pressure returned to baseline and stable Postop Assessment: no apparent nausea or vomiting Anesthetic complications: no   No notable events documented.  Last Vitals:  Vitals:   08/20/21 1625 08/20/21 1630  BP: 128/68 119/69  Pulse: 91 86  Resp:  14  Temp:  37 C  SpO2: 100% 100%    Last Pain:  Vitals:   08/20/21 1630  PainSc: 0-No pain                 Catalina Gravel

## 2021-08-20 NOTE — Anesthesia Preprocedure Evaluation (Addendum)
Anesthesia Evaluation  Patient identified by MRN, date of birth, ID band Patient awake    Reviewed: Allergy & Precautions, NPO status , Patient's Chart, lab work & pertinent test results  History of Anesthesia Complications (+) PONV and history of anesthetic complications  Airway Mallampati: I  TM Distance: >3 FB Neck ROM: Full    Dental no notable dental hx.    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 Difficulty with phonation x 4 months after, Saw ENT. No issues s/p surgery for his nephrectomy.  Reproductive/Obstetrics negative OB ROS                           Anesthesia Physical Anesthesia Plan  ASA: 2  Anesthesia Plan: General   Post-op Pain Management:    Induction: Intravenous  PONV Risk Score and Plan: 3 and Scopolamine patch - Pre-op, Midazolam, Treatment may vary due to age or medical condition, Dexamethasone and Ondansetron  Airway Management Planned: Oral ETT  Additional Equipment: None  Intra-op Plan:   Post-operative Plan: Extubation in OR  Informed Consent: I have reviewed the patients History and Physical, chart, labs and discussed the procedure including the risks, benefits and alternatives for the proposed anesthesia with the patient or authorized representative who  has indicated his/her understanding and acceptance.     Dental advisory given  Plan Discussed with: Anesthesiologist and CRNA  Anesthesia Plan Comments:        Anesthesia Quick Evaluation

## 2021-08-20 NOTE — Addendum Note (Signed)
Addendum  created 08/20/21 1747 by Pervis Hocking, DO   Order Reconciliation Section accessed

## 2021-08-20 NOTE — Transfer of Care (Signed)
Immediate Anesthesia Transfer of Care Note  Patient: Brandon Booker  Procedure(s) Performed: LAPAROSCOPIC VENTRAL HERNIA REPAIR WITH MESH AND BILATERAL TAP BLOCK (Abdomen)  Patient Location: PACU  Anesthesia Type:General  Level of Consciousness: awake, alert  and oriented  Airway & Oxygen Therapy: Patient Spontanous Breathing and Patient connected to face mask oxygen  Post-op Assessment: Report given to RN and Post -op Vital signs reviewed and stable  Post vital signs: Reviewed and stable  Last Vitals:  Vitals Value Taken Time  BP 128/68 08/20/21 1624  Temp    Pulse 91 08/20/21 1625  Resp    SpO2 100 % 08/20/21 1625  Vitals shown include unvalidated device data.  Last Pain:  Vitals:   08/20/21 1114  PainSc: 0-No pain      Patients Stated Pain Goal: 3 (64/38/38 [id number])  Complications: No notable events documented.

## 2021-08-21 ENCOUNTER — Encounter (HOSPITAL_COMMUNITY): Payer: Self-pay | Admitting: Surgery
